# Patient Record
Sex: Female | Born: 1975 | Race: White | Hispanic: No | Smoking: Never smoker
Health system: Southern US, Community
[De-identification: ages and names within clinical notes are randomized; demographics above are authoritative.]

## PROBLEM LIST (undated history)

## (undated) DIAGNOSIS — A630 Anogenital (venereal) warts: Secondary | ICD-10-CM

## (undated) DIAGNOSIS — I10 Essential (primary) hypertension: Secondary | ICD-10-CM

## (undated) HISTORY — PX: UTERINE FIBROID SURGERY: SHX826

## (undated) HISTORY — DX: Anogenital (venereal) warts: A63.0

---

## 2012-01-25 DIAGNOSIS — O99820 Streptococcus B carrier state complicating pregnancy: Secondary | ICD-10-CM | POA: Insufficient documentation

## 2012-01-25 DIAGNOSIS — IMO0001 Reserved for inherently not codable concepts without codable children: Secondary | ICD-10-CM

## 2012-01-25 HISTORY — DX: Reserved for inherently not codable concepts without codable children: IMO0001

## 2012-01-25 HISTORY — DX: Streptococcus B carrier state complicating pregnancy: O99.820

## 2018-12-12 DIAGNOSIS — Z3042 Encounter for surveillance of injectable contraceptive: Secondary | ICD-10-CM | POA: Diagnosis not present

## 2019-03-01 DIAGNOSIS — I10 Essential (primary) hypertension: Secondary | ICD-10-CM | POA: Diagnosis not present

## 2019-03-01 DIAGNOSIS — Z3042 Encounter for surveillance of injectable contraceptive: Secondary | ICD-10-CM | POA: Diagnosis not present

## 2019-05-31 DIAGNOSIS — Z3042 Encounter for surveillance of injectable contraceptive: Secondary | ICD-10-CM | POA: Diagnosis not present

## 2019-07-03 ENCOUNTER — Emergency Department: Payer: Self-pay

## 2019-07-03 ENCOUNTER — Other Ambulatory Visit: Payer: Self-pay

## 2019-07-03 ENCOUNTER — Emergency Department
Admission: EM | Admit: 2019-07-03 | Discharge: 2019-07-03 | Disposition: A | Discharge status: Home / Self Care | Payer: Self-pay | Attending: Emergency Medicine | Admitting: Emergency Medicine

## 2019-07-03 ENCOUNTER — Encounter: Payer: Self-pay | Admitting: Emergency Medicine

## 2019-07-03 DIAGNOSIS — I10 Essential (primary) hypertension: Secondary | ICD-10-CM | POA: Insufficient documentation

## 2019-07-03 DIAGNOSIS — Z79899 Other long term (current) drug therapy: Secondary | ICD-10-CM | POA: Insufficient documentation

## 2019-07-03 DIAGNOSIS — R079 Chest pain, unspecified: Secondary | ICD-10-CM

## 2019-07-03 HISTORY — DX: Essential (primary) hypertension: I10

## 2019-07-03 LAB — BASIC METABOLIC PANEL
Anion gap: 16 — ABNORMAL HIGH (ref 5–15)
BUN: 9 mg/dL (ref 6–20)
CO2: 22 mmol/L (ref 22–32)
Calcium: 9.4 mg/dL (ref 8.9–10.3)
Chloride: 100 mmol/L (ref 98–111)
Creatinine, Ser: 0.66 mg/dL (ref 0.44–1.00)
GFR calc Af Amer: >60 mL/min (ref >60)
GFR calc non Af Amer: >60 mL/min (ref >60)
Glucose, Bld: 117 mg/dL — ABNORMAL HIGH (ref 70–99)
Potassium: 3.7 mmol/L (ref 3.5–5.1)
Sodium: 138 mmol/L (ref 135–145)

## 2019-07-03 LAB — CBC
HCT: 42.8 % (ref 36.0–46.0)
Hemoglobin: 14.8 g/dL (ref 12.0–15.0)
MCH: 31.7 pg (ref 26.0–34.0)
MCHC: 34.6 g/dL (ref 30.0–36.0)
MCV: 91.6 fL (ref 80.0–100.0)
Platelets: 276 10*3/uL (ref 150–400)
RBC: 4.67 MIL/uL (ref 3.87–5.11)
RDW: 12.5 % (ref 11.5–15.5)
WBC: 6.8 10*3/uL (ref 4.0–10.5)
nRBC: 0 % (ref 0.0–0.2)

## 2019-07-03 LAB — TROPONIN I (HIGH SENSITIVITY)
Troponin I (High Sensitivity): 3 ng/L (ref <18)
Troponin I (High Sensitivity): 6 ng/L (ref <18)
Troponin I (High Sensitivity): 5 ng/L (ref <18)

## 2019-07-03 MED ORDER — HYDROCHLOROTHIAZIDE 12.5 MG PO CAPS: 12.5000 mg | ORAL_CAPSULE | Freq: Every day | ORAL | 11 refills | Status: DC

## 2019-07-03 NOTE — ED Notes (Signed)
Pt offered something to drink

## 2019-07-03 NOTE — ED Triage Notes (Addendum)
Patient presents to the ED with chest pain that woke her up out of her sleep at 4am.  Patient reports mid-sternal chest pain that radiates down her right arm.  Patient states pain has been intermittent between arm and chest.  Patient reports history of hypertension and states she stopped taking her medication about 3 years ago without MD supervision.

## 2019-07-03 NOTE — ED Notes (Signed)
Patient had to leave ED room quickly due to critical patient arrived.  She has been sitting in the hall.  Says she is okay, just a bit anxious.  No distress.  Vs rechecked and explained that dr is in with the critical patient right now.

## 2019-07-03 NOTE — ED Notes (Signed)
Patient signed paper discharge form, no computer available.

## 2019-07-03 NOTE — ED Provider Notes (Signed)
[facility] Emergency Department Provider Note   ____________________________________________   First MD Initiated Contact with Patient 07/03/19 1154     (approximate)  I have reviewed the triage vital signs and the nursing notes.   HISTORY  Chief Complaint Chest Pain    HPI Ashley Massey is a 44 y.o. female who reports she woke up with chest pain at 4:00 this morning the pain was in the middle of her chest and radiated down the right arm.  She says sometimes it seems to switch between the arm and the chest.  She said it briefly radiated to her back for about a second or 2 and then stop.  She has a history of hypertension for several years and stopped taking her medicines about 3 years ago because she ran out of the medicines and had moved and did not have a doctor any longer.  She has not had this kind of chest pain before.   Pain is currently gone.  Pain was severe achy and tight.  She did not have any sweatiness or shortness of breath.  The pain did not radiate elsewhere.      Past Medical History:  Diagnosis Date  . Hypertension     There are no problems to display for this patient.   Past Surgical History:  Procedure Laterality Date  . UTERINE FIBROID SURGERY      Prior to Admission medications   Medication Sig Start Date End Date Taking? Authorizing Provider  hydrochlorothiazide (MICROZIDE) 12.5 MG capsule Take 1 capsule (12.5 mg total) by mouth daily. 07/03/19 07/02/20  Nena Polio, MD    Allergies Patient has no known allergies.  No family history on file.  Social History Social History   Tobacco Use  . Smoking status: Never Smoker  . Smokeless tobacco: Never Used  Substance Use Topics  . Alcohol use: Yes    Comment: daily (1-2)  . Drug use: Not on file    Review of Systems  Constitutional: No fever/chills Eyes: No visual changes. ENT: No sore throat. Cardiovascular: Currently denies chest pain. Respiratory:  Currently denies shortness of breath. Gastrointestinal: No abdominal pain.  No nausea, no vomiting.   Genitourinary: Negative for dysuria. Musculoskeletal: Negative for back pain. Skin: Negative for rash. Neurological: Negative for headaches, focal weakness   ____________________________________________   PHYSICAL EXAM:  VITAL SIGNS: ED Triage Vitals  Enc Vitals Group     BP 07/03/19 0901 (!) 195/127     Pulse Rate 07/03/19 0901 95     Resp 07/03/19 0901 16     Temp 07/03/19 0901 99 F (37.2 C)     Temp Source 07/03/19 0901 Oral     SpO2 07/03/19 0901 100 %     Weight 07/03/19 0902 140 lb (63.5 kg)     Height 07/03/19 0902 5\' 3"  (1.6 m)     Head Circumference --      Peak Flow --      Pain Score 07/03/19 0901 4     Pain Loc --      Pain Edu? --      Excl. in [place]? --     Constitutional: Alert and oriented. Well appearing and in no acute distress. Eyes: Conjunctivae are normal.  Head: Atraumatic. Nose: No congestion/rhinnorhea. Mouth/Throat: Mucous membranes are moist.  Oropharynx non-erythematous. Neck: No stridor.  Cardiovascular: Normal rate, regular rhythm. Grossly normal heart sounds.  Good peripheral circulation. Respiratory: Normal respiratory effort.  No retractions. Lungs CTAB.  Gastrointestinal: Soft and nontender. No distention. No abdominal bruits. No CVA tenderness. Musculoskeletal: No lower extremity tenderness nor edema.   Neurologic:  Normal speech and language. No gross focal neurologic deficits are appreciated.  Skin:  Skin is warm, dry and intact. No rash noted.   ____________________________________________   LABS (all labs ordered are listed, but only abnormal results are displayed)  Labs Reviewed  BASIC METABOLIC PANEL - Abnormal; Notable for the following components:      Result Value   Glucose, Bld 117 (*)    Anion gap 16 (*)    All other components within normal limits  CBC  POC URINE PREG, ED  TROPONIN I (HIGH SENSITIVITY)  TROPONIN I  (HIGH SENSITIVITY)  TROPONIN I (HIGH SENSITIVITY)   ____________________________________________  EKG EKG read interpreted by me shows normal sinus rhythm rate of 90 normal axis no acute ST-T changes  ____________________________________________  RADIOLOGY  ED MD interpretation: Chest x-ray read by radiology reviewed by me shows no acute changes  Official radiology report(s): DG Chest 2 View  Result Date: 07/03/2019 CLINICAL DATA:  Chest pain EXAM: CHEST - 2 VIEW COMPARISON:  None. FINDINGS: Lungs are clear. Heart size and pulmonary vascularity are normal. No adenopathy. No pneumothorax. No bone lesions. IMPRESSION: No abnormality noted. Electronically Signed   By: Lowella Grip III M.D.   On: 07/03/2019 09:32    ____________________________________________   PROCEDURES  Procedure(s) performed (including Critical Care):  Procedures   ____________________________________________   INITIAL IMPRESSION / ASSESSMENT AND PLAN / ED COURSE Patient is pain-free.  I will start her on some HCTZ which she thinks she was taking before.  We will have her follow-up with primary care and cardiology.  Troponin remain negative and is now decreasing somewhat from its high of 6.  High normal is 18.  She will have to follow-up with primary care several times likely to get this blood pressure under control.              ____________________________________________   FINAL CLINICAL IMPRESSION(S) / ED DIAGNOSES  Final diagnoses:  Hypertension, unspecified type  Nonspecific chest pain     ED Discharge Orders         Ordered    hydrochlorothiazide (MICROZIDE) 12.5 MG capsule  Daily     Discontinue  Reprint     07/03/19 1619           Note:  This document was prepared using Dragon voice recognition software and may include unintentional dictation errors.    Nena Polio, MD 07/03/19 [phone]

## 2019-07-03 NOTE — Discharge Instructions (Addendum)
The lab work looks okay.  I will have you follow-up with a cardiologist at least once.  Please call Dr. Cyndi Lennert office and arrange follow-up.  It is my understanding that this is doable at least for 1 visit even without insurance at all.  Please take the HCTZ 1 pill a day.  Follow-up with primary care to continue management of your blood pressure.  You can follow-up with the [facility] or the [facility] or the [facility] or [facility].  You can also try the [facility] or [place] [facility].  All of these require paperwork to be filled out and are somewhat hard to get into because there are a lot of patients trying to get into them.  Please return here for any further chest pain or blood pressures over 180 for the top number.  Please watch her salt and get a reasonable amount of exercise every day.  Return for any further problems.

## 2019-07-06 NOTE — Progress Notes (Signed)
New patient visit   Patient: Ashley Massey   DOB: [birth year]   44 y.o. Female  MRN: [id number] Visit Date: 07/07/2019  Today's healthcare provider: Marcille Buffy, FNP   Chief Complaint  Patient presents with  . New Patient (Initial Visit)   Subjective    Ashley Massey is a 44 y.o. female who presents today as a new patient to establish care.  HPI  She was seen in the ER on 07/03/2019 due to elevated BP and chest pain. She was started on HCTZ 12.5mg  daily, and reports good compliance. She denies any side effects. She has been checking her BP at home and reports that it is still running "high". She denies any episodes of dizziness, headache, or chest pain since her ER visit.   She feels much better today and has not had any episodes since. She smoked as a teenager only occasionally to look cool she reports and not ever habitual. She has had associated chest pressure and shortness of breath for years she reports.  She has always associated it with her anxiety however she reports that with this episode on 07/03/19 she started having chest pressure/ pain associated with radiating pain down her right arm that woke her from sleep and she could not get comfortable. She then went to the ED.  Denies any other associated pain or reflux.  Chest X Ray in ED most recent visit was negative. She denies any known tachycardia.    History of Hypertension with pregnancy and medication were discontinues.   Drinks around 12 beer per week.   She reports she was previously had anxiety medication and has not been taking medications since 2017. She was on Lexpro 20 mg.  She said it made her too relaxed. She was also on this for a long while and took xanax for a while however it has been a few years. She did not like the Lexapro she reports. She did try one of her daughters Atarax pills   and it helped with her anxiety.   Denies any asthma history or inhaler use.     [facility] She is due for [facility]AP soon. She is due again for Depo - she is unsure not due yet- records will be requested for PAP date and Depo. She denies any chance of pregnancy today or need of pregnancy test.     Patient  denies any fever, body aches,chills, rash, , nausea, vomiting, or diarrhea. Denies any chest pain or shortness of breath at visit today or since ED visit.   Denies dizziness, lightheadedness, pre syncopal or syncopal episodes.    Past Medical History:  Diagnosis Date  . Hypertension    Past Surgical History:  Procedure Laterality Date  . UTERINE FIBROID SURGERY     Family Status  Relation Name Status  . Mother  Alive  . Father  Alive  . Sister  Alive  . MGM  Deceased  . MGF  Deceased  . PGM  Alive  . PGF  Alive   Family History  Problem Relation Age of Onset  . Hypertension Mother   . Hypertension Father   . Healthy Sister   . Hypertension Maternal Grandmother   . Hypertension Maternal Grandfather   . Heart disease Paternal Grandmother   . Hypertension Paternal Grandmother   . Hypertension Paternal Grandfather    Social History   Socioeconomic History  . Marital status: Single    Spouse name: Not on file  .  Number of children: Not on file  . Years of education: Not on file  . Highest education level: Not on file  Occupational History  . Not on file  Tobacco Use  . Smoking status: Never Smoker  . Smokeless tobacco: Never Used  Substance and Sexual Activity  . Alcohol use: Yes    Alcohol/week: 12.0 standard drinks    Types: 12 Cans of beer per week    Comment: daily (1-2)  . Drug use: Never  . Sexual activity: Not on file  Other Topics Concern  . Not on file  Social History Narrative  . Not on file   Social Determinants of Health   Financial Resource Strain:   . Difficulty of Paying Living Expenses:   Food Insecurity:   . Worried About Charity fundraiser in the Last Year:   . Arboriculturist in the Last Year:   Transportation  Needs:   . Film/video editor (Medical):   Marland Kitchen Lack of Transportation (Non-Medical):   Physical Activity:   . Days of Exercise per Week:   . Minutes of Exercise per Session:   Stress:   . Feeling of Stress :   Social Connections:   . Frequency of Communication with Friends and Family:   . Frequency of Social Gatherings with Friends and Family:   . Attends Religious Services:   . Active Member of Clubs or Organizations:   . Attends Archivist Meetings:   Marland Kitchen Marital Status:    Outpatient Medications Prior to Visit  Medication Sig  . medroxyPROGESTERone (DEPO-PROVERA) 150 MG/ML injection Inject 150 mg into the muscle every 3 (three) months.  . [DISCONTINUED] hydrochlorothiazide (MICROZIDE) 12.5 MG capsule Take 1 capsule (12.5 mg total) by mouth daily.  . [DISCONTINUED] busPIRone (BUSPAR) 5 MG tablet Take by mouth. (Patient not taking: Reported on 07/07/2019)  . [DISCONTINUED] escitalopram (LEXAPRO) 20 MG tablet Take by mouth. (Patient not taking: Reported on 07/07/2019)  . [DISCONTINUED] losartan (COZAAR) 50 MG tablet Take by mouth. (Patient not taking: Reported on 07/07/2019)   No facility-administered medications prior to visit.  took Cozaar with pregnancy. [facility].  No Known Allergies  Immunization History  Administered Date(s) Administered  . Influenza Split 12/02/2011  . Tdap 12/27/2011    Health Maintenance  Topic Date Due  . Hepatitis C Screening  Never done  . HIV Screening  Never done  . PAP SMEAR-Modifier  Never done  . INFLUENZA VACCINE  08/06/2019  . TETANUS/TDAP  12/26/2021    Patient Care Team: Doreen Beam, FNP as PCP - General (Family Medicine)  Review of Systems  Constitutional: Negative.   HENT: Negative.   Eyes: Negative.   Respiratory: Negative.   Cardiovascular: Negative.   Gastrointestinal: Negative.   Endocrine: Negative.   Genitourinary: Negative.   Musculoskeletal: Negative.   Skin: Negative.     Allergic/Immunologic: Negative.   Neurological: Negative.   Hematological: Negative.   Psychiatric/Behavioral: Negative.     Last CBC Lab Results  Component Value Date   WBC 6.8 07/03/2019   HGB 14.8 07/03/2019   HCT 42.8 07/03/2019   MCV 91.6 07/03/2019   MCH 31.7 07/03/2019   RDW 12.5 07/03/2019   PLT 276 62/69/4854   Last metabolic panel Lab Results  Component Value Date   GLUCOSE 117 (H) 07/03/2019   NA 138 07/03/2019   K 3.7 07/03/2019   CL 100 07/03/2019   CO2 22 07/03/2019   BUN 9 07/03/2019  CREATININE 0.66 07/03/2019   GFRNONAA >60 07/03/2019   GFRAA >60 07/03/2019   CALCIUM 9.4 07/03/2019   ANIONGAP 16 (H) 07/03/2019      Objective    BP (!) 148/86 (BP Location: Left Arm, Patient Position: Sitting, Cuff Size: Normal)   Pulse 90   Temp 97.8 F (36.6 C)   Ht 5\' 3"  (1.6 m)   Wt 134 lb (60.8 kg)   SpO2 97%   BMI 23.74 kg/m  Physical Exam Vitals reviewed.  Constitutional:      General: She is not in acute distress.    Appearance: Normal appearance. She is well-developed. She is not diaphoretic.     Interventions: She is not intubated. HENT:     Head: Normocephalic and atraumatic.     Right Ear: External ear normal.     Left Ear: External ear normal.     Nose: Nose normal.     Mouth/Throat:     Mouth: Mucous membranes are moist.     Pharynx: Oropharynx is clear. Uvula midline. No oropharyngeal exudate.     Tonsils: No tonsillar exudate.  Eyes:     General: Lids are normal. No scleral icterus.       Right eye: No discharge.        Left eye: No discharge.     Conjunctiva/sclera: Conjunctivae normal.     Right eye: Right conjunctiva is not injected. No exudate or hemorrhage.    Left eye: Left conjunctiva is not injected. No exudate or hemorrhage.    Pupils: Pupils are equal, round, and reactive to light.  Neck:     Thyroid: No thyroid mass or thyromegaly (mildly enlarged on palpation ).     Vascular: Normal carotid pulses. No carotid bruit,  hepatojugular reflux or JVD.     Trachea: Trachea and phonation normal. No tracheal tenderness or tracheal deviation.     Meningeal: Brudzinski's sign and Kernig's sign absent.  Cardiovascular:     Rate and Rhythm: Normal rate and regular rhythm.     Pulses: Normal pulses.          Radial pulses are 2+ on the right side and 2+ on the left side.       Dorsalis pedis pulses are 2+ on the right side and 2+ on the left side.       Posterior tibial pulses are 2+ on the right side and 2+ on the left side.     Heart sounds: Normal heart sounds, S1 normal and S2 normal. Heart sounds not distant. No murmur heard.  No friction rub. No gallop.   Pulmonary:     Effort: Pulmonary effort is normal. No tachypnea, bradypnea, accessory muscle usage or respiratory distress. She is not intubated.     Breath sounds: Normal breath sounds. No stridor. No wheezing or rales.  Chest:     Chest wall: No tenderness.  Abdominal:     General: Bowel sounds are normal. There is no distension or abdominal bruit.     Palpations: Abdomen is soft. There is no shifting dullness, fluid wave, hepatomegaly, splenomegaly, mass or pulsatile mass.     Tenderness: There is no abdominal tenderness. There is no guarding or rebound.     Hernia: No hernia is present.  Musculoskeletal:        General: No tenderness or deformity. Normal range of motion.     Cervical back: Full passive range of motion without pain, normal range of motion and neck supple. No edema, erythema or  rigidity. No spinous process tenderness or muscular tenderness. Normal range of motion.     Right lower leg: No edema.     Left lower leg: No edema.  Lymphadenopathy:     Head:     Right side of head: No submental, submandibular, tonsillar, preauricular, posterior auricular or occipital adenopathy.     Left side of head: No submental, submandibular, tonsillar, preauricular, posterior auricular or occipital adenopathy.     Cervical: No cervical adenopathy.      Right cervical: No superficial, deep or posterior cervical adenopathy.    Left cervical: No superficial, deep or posterior cervical adenopathy.     Upper Body:     Right upper body: No supraclavicular or pectoral adenopathy.     Left upper body: No supraclavicular or pectoral adenopathy.  Skin:    General: Skin is warm and dry.     Coloration: Skin is not pale.     Findings: No abrasion, bruising, burn, ecchymosis, erythema, lesion, petechiae or rash.     Nails: There is no clubbing.  Neurological:     Mental Status: She is alert and oriented to person, place, and time.     GCS: GCS eye subscore is 4. GCS verbal subscore is 5. GCS motor subscore is 6.     Cranial Nerves: No cranial nerve deficit.     Sensory: No sensory deficit.     Motor: No tremor, atrophy, abnormal muscle tone or seizure activity.     Coordination: Coordination normal.     Gait: Gait normal.     Deep Tendon Reflexes: Reflexes are normal and symmetric. Reflexes normal. Babinski sign absent on the right side. Babinski sign absent on the left side.     Reflex Scores:      Tricep reflexes are 2+ on the right side and 2+ on the left side.      Bicep reflexes are 2+ on the right side and 2+ on the left side.      Brachioradialis reflexes are 2+ on the right side and 2+ on the left side.      Patellar reflexes are 2+ on the right side and 2+ on the left side.      Achilles reflexes are 2+ on the right side and 2+ on the left side. Psychiatric:        Speech: Speech normal.        Behavior: Behavior normal.        Thought Content: Thought content normal.        Judgment: Judgment normal.     Depression Screen PHQ 2/9 Scores 07/07/2019  PHQ - 2 Score 0   Results for orders placed or performed in visit on 07/07/19  POCT urinalysis dipstick  Result Value Ref Range   Color, UA yellow    Clarity, UA clear    Glucose, UA Negative Negative   Bilirubin, UA negative    Ketones, UA negative    Spec Grav, UA <=1.005 (A)  1.010 - 1.025   Blood, UA negative    pH, UA 6.0 5.0 - 8.0   Protein, UA Negative Negative   Urobilinogen, UA 0.2 0.2 or 1.0 E.U./dL   Nitrite, UA negative    Leukocytes, UA Negative Negative    Assessment & Plan      1. Anxiety - hydrOXYzine (ATARAX/VISTARIL) 50 MG tablet; Take 1 tablet (50 mg total) by mouth every 8 (eight) hours as needed for anxiety (may causes drowsiness, No driving with medication or legal  decisions).  Dispense: 60 tablet; Refill: 0 - buPROPion (WELLBUTRIN XL) 150 MG 24 hr tablet; Take 1 tablet (150 mg total) by mouth daily.  Dispense: 60 tablet; Refill: 0 - CBC with Differential/Platelet - Comprehensive metabolic panel - TSH  2. Screening for blood or protein in urine - POCT urinalysis dipstick  3. Hypertension, unspecified type - EKG 12-Lead  4. Encounter for screening mammogram for malignant neoplasm of breast - MM Digital Screening  5. Screening, lipid - Lipid panel  6. Vitamin D deficiency - VITAMIN D 25 Hydroxy (Vit-D Deficiency, Fractures)  7. History of chest pain - EKG 12-Lead  Medications Discontinued During This Encounter  Medication Reason  . busPIRone (BUSPAR) 5 MG tablet Completed Course  . escitalopram (LEXAPRO) 20 MG tablet Completed Course  . losartan (COZAAR) 50 MG tablet Completed Course  . hydrochlorothiazide (MICROZIDE) 12.5 MG capsule   She was not taking anything above except HCTZ 12.5 mg qd.  Increasing dosage to HCTZ 25mg  per day she will keep logs at home and if consistently over 140/90 she will call office. Consider Ace/ ARB.  EKG - ok improved in appearance from ED EKG .  Keep cardiology follow up 07/31/2019 from ED referral.   Meds ordered this encounter  Medications  . hydrochlorothiazide (HYDRODIURIL) 25 MG tablet    Sig: Take 1 tablet (25 mg total) by mouth daily.    Dispense:  90 tablet    Refill:  0  . hydrOXYzine (ATARAX/VISTARIL) 50 MG tablet    Sig: Take 1 tablet (50 mg total) by mouth every 8 (eight)  hours as needed for anxiety (may causes drowsiness, No driving with medication or legal decisions).    Dispense:  60 tablet    Refill:  0  . buPROPion (WELLBUTRIN XL) 150 MG 24 hr tablet    Sig: Take 1 tablet (150 mg total) by mouth daily.    Dispense:  60 tablet    Refill:  0   Fasting labs ordered. She will call to set up time for mammogram.  Orders Placed This Encounter  Procedures  . MM Digital Screening    Order Specific Question:   Reason for Exam (SYMPTOM  OR DIAGNOSIS REQUIRED)    Answer:   screening no previous    Order Specific Question:   Is the patient pregnant?    Answer:   No    Order Specific Question:   Preferred imaging location?    Answer:   [facility]  . CBC with Differential/Platelet  . Comprehensive metabolic panel  . TSH  . VITAMIN D 25 Hydroxy (Vit-D Deficiency, Fractures)  . Lipid panel  . POCT urinalysis dipstick  . EKG 12-Lead    Red Flags discussed. The patient was given clear instructions to go to ER or return to medical center if any red flags develop, symptoms do not improve, worsen or new problems develop. They verbalized understanding.  Advised patient call the office or your primary care doctor for an appointment if no improvement within 72 hours or if any symptoms change or worsen at any time  Advised ER or urgent Care if after hours or on weekend. Call 911 for emergency symptoms at any time.Patinet verbalized understanding of all instructions given/reviewed and treatment plan and has no further questions or concerns at this time.    Follow up on anxiety- Pap date ? Depo due date ? - post cardiology visit. / needs CPE  Return in about 1 month (around 08/07/2019), or if symptoms worsen  or fail to improve, for at any time for any worsening symptoms, Go to Emergency room/ urgent care if worse.     IWellington Hampshire Jaecob Lowden, FNP, have reviewed all documentation for this visit. The documentation on 07/07/19 for the exam, diagnosis, procedures, and  orders are all accurate and complete.   Marcille Buffy, [place] [phone] (phone) [phone] (fax)  [place]

## 2019-07-07 ENCOUNTER — Encounter: Payer: Self-pay | Admitting: Adult Health

## 2019-07-07 ENCOUNTER — Ambulatory Visit (INDEPENDENT_AMBULATORY_CARE_PROVIDER_SITE_OTHER): Payer: Medicaid Other | Admitting: Adult Health

## 2019-07-07 ENCOUNTER — Other Ambulatory Visit: Payer: Self-pay

## 2019-07-07 VITALS — BP 148/86 | HR 90 | Temp 97.8°F | Ht 63.0 in | Wt 134.0 lb

## 2019-07-07 DIAGNOSIS — I10 Essential (primary) hypertension: Secondary | ICD-10-CM

## 2019-07-07 DIAGNOSIS — E559 Vitamin D deficiency, unspecified: Secondary | ICD-10-CM

## 2019-07-07 DIAGNOSIS — F419 Anxiety disorder, unspecified: Secondary | ICD-10-CM

## 2019-07-07 DIAGNOSIS — Z87898 Personal history of other specified conditions: Secondary | ICD-10-CM

## 2019-07-07 DIAGNOSIS — Z1389 Encounter for screening for other disorder: Secondary | ICD-10-CM

## 2019-07-07 DIAGNOSIS — Z1231 Encounter for screening mammogram for malignant neoplasm of breast: Secondary | ICD-10-CM

## 2019-07-07 DIAGNOSIS — Z13220 Encounter for screening for lipoid disorders: Secondary | ICD-10-CM

## 2019-07-07 LAB — POCT URINALYSIS DIPSTICK
Bilirubin, UA: NEGATIVE
Blood, UA: NEGATIVE
Glucose, UA: NEGATIVE
Ketones, UA: NEGATIVE
Leukocytes, UA: NEGATIVE
Nitrite, UA: NEGATIVE
Protein, UA: NEGATIVE
Spec Grav, UA: <=1.005 — AB (ref 1.010–1.025)
Urobilinogen, UA: 0.2 E.U./dL
pH, UA: 6 (ref 5.0–8.0)

## 2019-07-07 MED ORDER — BUPROPION HCL ER (XL) 150 MG PO TB24: 150.0000 mg | ORAL_TABLET | Freq: Every day | ORAL | 0 refills | Status: DC

## 2019-07-07 MED ORDER — HYDROXYZINE HCL 50 MG PO TABS: 50.0000 mg | ORAL_TABLET | Freq: Three times a day (TID) | ORAL | 0 refills | Status: DC | PRN

## 2019-07-07 MED ORDER — HYDROCHLOROTHIAZIDE 25 MG PO TABS: 25.0000 mg | ORAL_TABLET | Freq: Every day | ORAL | 0 refills | Status: DC

## 2019-07-07 NOTE — Patient Instructions (Addendum)
It was nice meeting you and  Our practice looks forward to helping you with your health.    Return to our lab for fasting labs if you are not fasting today- nothing to  eat or drink for 8 hours before labs.   You can get your health department records sent to the office.  Labs ordered are below.  Orders Placed This Encounter  Procedures  . MM Digital Screening    Order Specific Question:   Reason for Exam (SYMPTOM  OR DIAGNOSIS REQUIRED)    Answer:   screening no previous    Order Specific Question:   Is the patient pregnant?    Answer:   No    Order Specific Question:   Preferred imaging location?    Answer:   [facility]  . CBC with Differential/Platelet  . Comprehensive metabolic panel  . TSH  . VITAMIN D 25 Hydroxy (Vit-D Deficiency, Fractures)  . Lipid panel  . POCT urinalysis dipstick  . EKG 12-Lead    Call to schedule mammogram they will not call you since it is a screening.  Call to schedule your screening mammogram. Your orders have been placed for your exam.  Let our office know if you have questions, concerns, or any difficulty scheduling.  If normal results then yearly screening mammograms are recommended unless you notice  Changes in your breast then you should schedule a follow up office visit. If abnormal results  Further imaging will be warranted and sooner follow up as determined by the radiologist at the [facility].   [facility] at [facility] [place], [place] [address]  Main: [phone]    Call to schedule sooner follow up if any symptoms worsening or not improving.  Seek after hours care if office is closed or if emergency.    DASH Eating Plan DASH stands for "Dietary Approaches to Stop Hypertension." The DASH eating plan is a healthy eating plan that has been shown to reduce high blood pressure (hypertension). It may also reduce your risk for type 2 diabetes, heart disease, and stroke. The DASH eating  plan may also help with weight loss. What are tips for following this plan?  General guidelines  Avoid eating more than 2,300 mg (milligrams) of salt (sodium) a day. If you have hypertension, you may need to reduce your sodium intake to 1,500 mg a day.  Limit alcohol intake to no more than 1 drink a day for nonpregnant women and 2 drinks a day for men. One drink equals 12 oz of beer, 5 oz of wine, or 1 oz of hard liquor.  Work with your health care provider to maintain a healthy body weight or to lose weight. Ask what an ideal weight is for you.  Get at least 30 minutes of exercise that causes your heart to beat faster (aerobic exercise) most days of the week. Activities may include walking, swimming, or biking.  Work with your health care provider or diet and nutrition specialist (dietitian) to adjust your eating plan to your individual calorie needs. Reading food labels   Check food labels for the amount of sodium per serving. Choose foods with less than 5 percent of the Daily Value of sodium. Generally, foods with less than 300 mg of sodium per serving fit into this eating plan.  To find whole grains, look for the word "whole" as the first word in the ingredient list. Shopping  Buy products labeled as "low-sodium" or "no salt  added."  Buy fresh foods. Avoid canned foods and premade or frozen meals. Cooking  Avoid adding salt when cooking. Use salt-free seasonings or herbs instead of table salt or sea salt. Check with your health care provider or pharmacist before using salt substitutes.  Do not fry foods. Cook foods using healthy methods such as baking, boiling, grilling, and broiling instead.  Cook with heart-healthy oils, such as olive, canola, soybean, or sunflower oil. Meal planning  Eat a balanced diet that includes: ? 5 or more servings of fruits and vegetables each day. At each meal, try to fill half of your plate with fruits and vegetables. ? Up to 6-8 servings of  whole grains each day. ? Less than 6 oz of lean meat, poultry, or fish each day. A 3-oz serving of meat is about the same size as a deck of cards. One egg equals 1 oz. ? 2 servings of low-fat dairy each day. ? A serving of nuts, seeds, or beans 5 times each week. ? Heart-healthy fats. Healthy fats called Omega-3 fatty acids are found in foods such as flaxseeds and coldwater fish, like sardines, salmon, and mackerel.  Limit how much you eat of the following: ? Canned or prepackaged foods. ? Food that is high in trans fat, such as fried foods. ? Food that is high in saturated fat, such as fatty meat. ? Sweets, desserts, sugary drinks, and other foods with added sugar. ? Full-fat dairy products.  Do not salt foods before eating.  Try to eat at least 2 vegetarian meals each week.  Eat more home-cooked food and less restaurant, buffet, and fast food.  When eating at a restaurant, ask that your food be prepared with less salt or no salt, if possible. What foods are recommended? The items listed may not be a complete list. Talk with your dietitian about what dietary choices are best for you. Grains Whole-grain or whole-wheat bread. Whole-grain or whole-wheat pasta. Brown rice. [place]. Bulgur. Whole-grain and low-sodium cereals. Pita bread. Low-fat, low-sodium crackers. Whole-wheat flour tortillas. Vegetables Fresh or frozen vegetables (raw, steamed, roasted, or grilled). Low-sodium or reduced-sodium tomato and vegetable juice. Low-sodium or reduced-sodium tomato sauce and tomato paste. Low-sodium or reduced-sodium canned vegetables. Fruits All fresh, dried, or frozen fruit. Canned fruit in natural juice (without added sugar). Meat and other protein foods Skinless chicken or [place]. Ground chicken or [place]. Pork with fat trimmed off. Fish and seafood. Egg whites. Dried beans, peas, or lentils. Unsalted nuts, nut butters, and seeds. Unsalted canned beans. Lean cuts of beef with fat  trimmed off. Low-sodium, lean deli meat. Dairy Low-fat (1%) or fat-free (skim) milk. Fat-free, low-fat, or reduced-fat cheeses. Nonfat, low-sodium ricotta or cottage cheese. Low-fat or nonfat yogurt. Low-fat, low-sodium cheese. Fats and oils Soft margarine without trans fats. Vegetable oil. Low-fat, reduced-fat, or light mayonnaise and salad dressings (reduced-sodium). Canola, safflower, olive, soybean, and sunflower oils. Avocado. Seasoning and other foods Herbs. Spices. Seasoning mixes without salt. Unsalted popcorn and pretzels. Fat-free sweets. What foods are not recommended? The items listed may not be a complete list. Talk with your dietitian about what dietary choices are best for you. Grains Baked goods made with fat, such as croissants, muffins, or some breads. Dry pasta or rice meal packs. Vegetables Creamed or fried vegetables. Vegetables in a cheese sauce. Regular canned vegetables (not low-sodium or reduced-sodium). Regular canned tomato sauce and paste (not low-sodium or reduced-sodium). Regular tomato and vegetable juice (not low-sodium or reduced-sodium). Angie Fava. Olives. Fruits Canned fruit  in a light or heavy syrup. Fried fruit. Fruit in cream or butter sauce. Meat and other protein foods Fatty cuts of meat. Ribs. Fried meat. Berniece Salines. Sausage. Bologna and other processed lunch meats. Salami. Fatback. Hotdogs. Bratwurst. Salted nuts and seeds. Canned beans with added salt. Canned or smoked fish. Whole eggs or egg yolks. Chicken or [place] with skin. Dairy Whole or 2% milk, cream, and half-and-half. Whole or full-fat cream cheese. Whole-fat or sweetened yogurt. Full-fat cheese. Nondairy creamers. Whipped toppings. Processed cheese and cheese spreads. Fats and oils Butter. Stick margarine. Lard. Shortening. Ghee. Bacon fat. Tropical oils, such as coconut, palm kernel, or palm oil. Seasoning and other foods Salted popcorn and pretzels. Onion salt, garlic salt, seasoned salt, table  salt, and sea salt. Worcestershire sauce. Tartar sauce. Barbecue sauce. Teriyaki sauce. Soy sauce, including reduced-sodium. Steak sauce. Canned and packaged gravies. Fish sauce. Oyster sauce. Cocktail sauce. Horseradish that you find on the shelf. Ketchup. Mustard. Meat flavorings and tenderizers. Bouillon cubes. Hot sauce and Tabasco sauce. Premade or packaged marinades. Premade or packaged taco seasonings. Relishes. Regular salad dressings. Where to find more information:  National Heart, Lung, and [place]: https://wilson-eaton.com/  American Heart Association: www.heart.org Summary  The DASH eating plan is a healthy eating plan that has been shown to reduce high blood pressure (hypertension). It may also reduce your risk for type 2 diabetes, heart disease, and stroke.  With the DASH eating plan, you should limit salt (sodium) intake to 2,300 mg a day. If you have hypertension, you may need to reduce your sodium intake to 1,500 mg a day.  When on the DASH eating plan, aim to eat more fresh fruits and vegetables, whole grains, lean proteins, low-fat dairy, and heart-healthy fats.  Work with your health care provider or diet and nutrition specialist (dietitian) to adjust your eating plan to your individual calorie needs. This information is not intended to replace advice given to you by your health care provider. Make sure you discuss any questions you have with your health care provider. Document Revised: 12/04/2016 Document Reviewed: 12/16/2015 Elsevier Patient Education  2020 [employer].  Hypertension, Adult Hypertension is another name for high blood pressure. High blood pressure forces your heart to work harder to pump blood. This can cause problems over time. There are two numbers in a blood pressure reading. There is a top number (systolic) over a bottom number (diastolic). It is best to have a blood pressure that is below 120/80. Healthy choices can help lower your blood pressure, or  you may need medicine to help lower it. What are the causes? The cause of this condition is not known. Some conditions may be related to high blood pressure. What increases the risk?  Smoking.  Having type 2 diabetes mellitus, high cholesterol, or both.  Not getting enough exercise or physical activity.  Being overweight.  Having too much fat, sugar, calories, or salt (sodium) in your diet.  Drinking too much alcohol.  Having long-term (chronic) kidney disease.  Having a family history of high blood pressure.  Age. Risk increases with age.  Race. You may be at higher risk if you are African American.  Gender. Men are at higher risk than women before age 73. After age 68, women are at higher risk than men.  Having obstructive sleep apnea.  Stress. What are the signs or symptoms?  High blood pressure may not cause symptoms. Very high blood pressure (hypertensive crisis) may cause: ? Headache. ? Feelings of worry or  nervousness (anxiety). ? Shortness of breath. ? Nosebleed. ? A feeling of being sick to your stomach (nausea). ? Throwing up (vomiting). ? Changes in how you see. ? Very bad chest pain. ? Seizures. How is this treated?  This condition is treated by making healthy lifestyle changes, such as: ? Eating healthy foods. ? Exercising more. ? Drinking less alcohol.  Your health care provider may prescribe medicine if lifestyle changes are not enough to get your blood pressure under control, and if: ? Your top number is above 130. ? Your bottom number is above 80.  Your personal target blood pressure may vary. Follow these instructions at home: Eating and drinking   If told, follow the DASH eating plan. To follow this plan: ? Fill one half of your plate at each meal with fruits and vegetables. ? Fill one fourth of your plate at each meal with whole grains. Whole grains include whole-wheat pasta, brown rice, and whole-grain bread. ? Eat or drink low-fat  dairy products, such as skim milk or low-fat yogurt. ? Fill one fourth of your plate at each meal with low-fat (lean) proteins. Low-fat proteins include fish, chicken without skin, eggs, beans, and tofu. ? Avoid fatty meat, cured and processed meat, or chicken with skin. ? Avoid pre-made or processed food.  Eat less than 1,500 mg of salt each day.  Do not drink alcohol if: ? Your doctor tells you not to drink. ? You are pregnant, may be pregnant, or are planning to become pregnant.  If you drink alcohol: ? Limit how much you use to:  0-1 drink a day for women.  0-2 drinks a day for men. ? Be aware of how much alcohol is in your drink. In the [place] one drink equals one 12 oz bottle of beer (355 mL), one 5 oz glass of wine (148 mL), or one 1 oz glass of hard liquor (44 mL). Lifestyle   Work with your doctor to stay at a healthy weight or to lose weight. Ask your doctor what the best weight is for you.  Get at least 30 minutes of exercise most days of the week. This may include walking, swimming, or biking.  Get at least 30 minutes of exercise that strengthens your muscles (resistance exercise) at least 3 days a week. This may include lifting weights or doing Pilates.  Do not use any products that contain nicotine or tobacco, such as cigarettes, e-cigarettes, and chewing tobacco. If you need help quitting, ask your doctor.  Check your blood pressure at home as told by your doctor.  Keep all follow-up visits as told by your doctor. This is important. Medicines  Take over-the-counter and prescription medicines only as told by your doctor. Follow directions carefully.  Do not skip doses of blood pressure medicine. The medicine does not work as well if you skip doses. Skipping doses also puts you at risk for problems.  Ask your doctor about side effects or reactions to medicines that you should watch for. Contact a doctor if you:  Think you are having a reaction to the medicine you  are taking.  Have headaches that keep coming back (recurring).  Feel dizzy.  Have swelling in your ankles.  Have trouble with your vision. Get help right away if you:  Get a very bad headache.  Start to feel mixed up (confused).  Feel weak or numb.  Feel faint.  Have very bad pain in your: ? Chest. ? Belly (abdomen).  Throw up  more than once.  Have trouble breathing. Summary  Hypertension is another name for high blood pressure.  High blood pressure forces your heart to work harder to pump blood.  For most people, a normal blood pressure is less than 120/80.  Making healthy choices can help lower blood pressure. If your blood pressure does not get lower with healthy choices, you may need to take medicine. This information is not intended to replace advice given to you by your health care provider. Make sure you discuss any questions you have with your health care provider. Document Revised: 09/01/2017 Document Reviewed: 09/01/2017 Elsevier Patient Education  2020 [place] Maintenance, Female Adopting a healthy lifestyle and getting preventive care are important in promoting health and wellness. Ask your health care provider about:  The right schedule for you to have regular tests and exams.  Things you can do on your own to prevent diseases and keep yourself healthy. What should I know about diet, weight, and exercise? Eat a healthy diet   Eat a diet that includes plenty of vegetables, fruits, low-fat dairy products, and lean protein.  Do not eat a lot of foods that are high in solid fats, added sugars, or sodium. Maintain a healthy weight Body mass index (BMI) is used to identify weight problems. It estimates body fat based on height and weight. Your health care provider can help determine your BMI and help you achieve or maintain a healthy weight. Get regular exercise Get regular exercise. This is one of the most important things you can do  for your health. Most adults should:  Exercise for at least 150 minutes each week. The exercise should increase your heart rate and make you sweat (moderate-intensity exercise).  Do strengthening exercises at least twice a week. This is in addition to the moderate-intensity exercise.  Spend less time sitting. Even light physical activity can be beneficial. Watch cholesterol and blood lipids Have your blood tested for lipids and cholesterol at 42 years of age, then have this test every 5 years. Have your cholesterol levels checked more often if:  Your lipid or cholesterol levels are high.  You are older than 42 years of age.  You are at high risk for heart disease. What should I know about cancer screening? Depending on your health history and family history, you may need to have cancer screening at various ages. This may include screening for:  Breast cancer.  Cervical cancer.  Colorectal cancer.  Skin cancer.  Lung cancer. What should I know about heart disease, diabetes, and high blood pressure? Blood pressure and heart disease  High blood pressure causes heart disease and increases the risk of stroke. This is more likely to develop in people who have high blood pressure readings, are of African descent, or are overweight.  Have your blood pressure checked: ? Every 3-5 years if you are 43-58 years of age. ? Every year if you are 18 years old or older. Diabetes Have regular diabetes screenings. This checks your fasting blood sugar level. Have the screening done:  Once every three years after age 36 if you are at a normal weight and have a low risk for diabetes.  More often and at a younger age if you are overweight or have a high risk for diabetes. What should I know about preventing infection? Hepatitis B If you have a higher risk for hepatitis B, you should be screened for this virus. Talk with your health care provider to  find out if you are at risk for hepatitis B  infection. Hepatitis C Testing is recommended for:  Everyone born from [birth year] through 1963.  Anyone with known risk factors for hepatitis C. Sexually transmitted infections (STIs)  Get screened for STIs, including gonorrhea and chlamydia, if: ? You are sexually active and are younger than 42 years of age. ? You are older than 43 years of age and your health care provider tells you that you are at risk for this type of infection. ? Your sexual activity has changed since you were last screened, and you are at increased risk for chlamydia or gonorrhea. Ask your health care provider if you are at risk.  Ask your health care provider about whether you are at high risk for HIV. Your health care provider may recommend a prescription medicine to help prevent HIV infection. If you choose to take medicine to prevent HIV, you should first get tested for HIV. You should then be tested every 3 months for as long as you are taking the medicine. Pregnancy  If you are about to stop having your period (premenopausal) and you may become pregnant, seek counseling before you get pregnant.  Take 400 to 800 micrograms (mcg) of folic acid every day if you become pregnant.  Ask for birth control (contraception) if you want to prevent pregnancy. Osteoporosis and menopause Osteoporosis is a disease in which the bones lose minerals and strength with aging. This can result in bone fractures. If you are 32 years old or older, or if you are at risk for osteoporosis and fractures, ask your health care provider if you should:  Be screened for bone loss.  Take a calcium or vitamin D supplement to lower your risk of fractures.  Be given hormone replacement therapy (HRT) to treat symptoms of menopause. Follow these instructions at home: Lifestyle  Do not use any products that contain nicotine or tobacco, such as cigarettes, e-cigarettes, and chewing tobacco. If you need help quitting, ask your health care  provider.  Do not use street drugs.  Do not share needles.  Ask your health care provider for help if you need support or information about quitting drugs. Alcohol use  Do not drink alcohol if: ? Your health care provider tells you not to drink. ? You are pregnant, may be pregnant, or are planning to become pregnant.  If you drink alcohol: ? Limit how much you use to 0-1 drink a day. ? Limit intake if you are breastfeeding.  Be aware of how much alcohol is in your drink. In the [place] one drink equals one 12 oz bottle of beer (355 mL), one 5 oz glass of wine (148 mL), or one 1 oz glass of hard liquor (44 mL). General instructions  Schedule regular health, dental, and eye exams.  Stay current with your vaccines.  Tell your health care provider if: ? You often feel depressed. ? You have ever been abused or do not feel safe at home. Summary  Adopting a healthy lifestyle and getting preventive care are important in promoting health and wellness.  Follow your health care provider's instructions about healthy diet, exercising, and getting tested or screened for diseases.  Follow your health care provider's instructions on monitoring your cholesterol and blood pressure. This information is not intended to replace advice given to you by your health care provider. Make sure you discuss any questions you have with your health care provider. Document Revised: 12/15/2017 Document Reviewed: 12/15/2017 Elsevier Patient Education  [place] Anxiety Disorder, Adult Generalized anxiety disorder (GAD) is a mental health disorder. People with this condition constantly worry about everyday events. Unlike normal anxiety, worry related to GAD is not triggered by a specific event. These worries also do not fade or get better with time. GAD interferes with life functions, including relationships, work, and school. GAD can vary from mild to severe. People with severe GAD can have  intense waves of anxiety with physical symptoms (panic attacks). What are the causes? The exact cause of GAD is not known. What increases the risk? This condition is more likely to develop in:  Women.  People who have a family history of anxiety disorders.  People who are very shy.  People who experience very stressful life events, such as the death of a loved one.  People who have a very stressful family environment. What are the signs or symptoms? People with GAD often worry excessively about many things in their lives, such as their health and family. They may also be overly concerned about:  Doing well at work.  Being on time.  Natural disasters.  Friendships. Physical symptoms of GAD include:  Fatigue.  Muscle tension or having muscle twitches.  Trembling or feeling shaky.  Being easily startled.  Feeling like your heart is pounding or racing.  Feeling out of breath or like you cannot take a deep breath.  Having trouble falling asleep or staying asleep.  Sweating.  Nausea, diarrhea, or irritable bowel syndrome (IBS).  Headaches.  Trouble concentrating or remembering facts.  Restlessness.  Irritability. How is this diagnosed? Your health care provider can diagnose GAD based on your symptoms and medical history. You will also have a physical exam. The health care provider will ask specific questions about your symptoms, including how severe they are, when they started, and if they come and go. Your health care provider may ask you about your use of alcohol or drugs, including prescription medicines. Your health care provider may refer you to a mental health specialist for further evaluation. Your health care provider will do a thorough examination and may perform additional tests to rule out other possible causes of your symptoms. To be diagnosed with GAD, a person must have anxiety that:  Is out of his or her control.  Affects several different aspects  of his or her life, such as work and relationships.  Causes distress that makes him or her unable to take part in normal activities.  Includes at least three physical symptoms of GAD, such as restlessness, fatigue, trouble concentrating, irritability, muscle tension, or sleep problems. Before your health care provider can confirm a diagnosis of GAD, these symptoms must be present more days than they are not, and they must last for six months or longer. How is this treated? The following therapies are usually used to treat GAD:  Medicine. Antidepressant medicine is usually prescribed for long-term daily control. Antianxiety medicines may be added in severe cases, especially when panic attacks occur.  Talk therapy (psychotherapy). Certain types of talk therapy can be helpful in treating GAD by providing support, education, and guidance. Options include: ? Cognitive behavioral therapy (CBT). People learn coping skills and techniques to ease their anxiety. They learn to identify unrealistic or negative thoughts and behaviors and to replace them with positive ones. ? Acceptance and commitment therapy (ACT). This treatment teaches people how to be mindful as a way to cope with unwanted thoughts and feelings. ? Biofeedback. This process trains you to  manage your body's response (physiological response) through breathing techniques and relaxation methods. You will work with a therapist while machines are used to monitor your physical symptoms.  Stress management techniques. These include yoga, meditation, and exercise. A mental health specialist can help determine which treatment is best for you. Some people see improvement with one type of therapy. However, other people require a combination of therapies. Follow these instructions at home:  Take over-the-counter and prescription medicines only as told by your health care provider.  Try to maintain a normal routine.  Try to anticipate stressful  situations and allow extra time to manage them.  Practice any stress management or self-calming techniques as taught by your health care provider.  Do not punish yourself for setbacks or for not making progress.  Try to recognize your accomplishments, even if they are small.  Keep all follow-up visits as told by your health care provider. This is important. Contact a health care provider if:  Your symptoms do not get better.  Your symptoms get worse.  You have signs of depression, such as: ? A persistently sad, cranky, or irritable mood. ? Loss of enjoyment in activities that used to bring you joy. ? Change in weight or eating. ? Changes in sleeping habits. ? Avoiding friends or family members. ? Loss of energy for normal tasks. ? Feelings of guilt or worthlessness. Get help right away if:  You have serious thoughts about hurting yourself or others. If you ever feel like you may hurt yourself or others, or have thoughts about taking your own life, get help right away. You can go to your nearest emergency department or call:  Your local emergency services (911 in the [place]  A suicide crisis helpline, such as the [facility] at 825-323-7102. This is open 24 hours a day. Summary  Generalized anxiety disorder (GAD) is a mental health disorder that involves worry that is not triggered by a specific event.  People with GAD often worry excessively about many things in their lives, such as their health and family.  GAD may cause physical symptoms such as restlessness, trouble concentrating, sleep problems, frequent sweating, nausea, diarrhea, headaches, and trembling or muscle twitching.  A mental health specialist can help determine which treatment is best for you. Some people see improvement with one type of therapy. However, other people require a combination of therapies. This information is not intended to replace advice given to you by your health  care provider. Make sure you discuss any questions you have with your health care provider. Document Revised: 12/04/2016 Document Reviewed: 11/12/2015 Elsevier Patient Education  [place]. Hydroxyzine capsules or tablets What is this medicine? HYDROXYZINE (hye [place] i zeen) is an antihistamine. This medicine is used to treat allergy symptoms. It is also used to treat anxiety and tension. This medicine can be used with other medicines to induce sleep before surgery. This medicine may be used for other purposes; ask your health care provider or pharmacist if you have questions. COMMON BRAND NAME(S): ANX, Atarax, Rezine, Vistaril What should I tell my health care provider before I take this medicine? They need to know if you have any of these conditions:  glaucoma  heart disease  history of irregular heartbeat  kidney disease  liver disease  lung or breathing disease, like asthma  stomach or intestine problems  thyroid disease  trouble passing urine  an unusual or allergic reaction to hydroxyzine, cetirizine, other medicines, foods, dyes or preservatives  pregnant  or trying to get pregnant  breast-feeding How should I use this medicine? Take this medicine by mouth with a full glass of water. Follow the directions on the prescription label. You may take this medicine with food or on an empty stomach. Take your medicine at regular intervals. Do not take your medicine more often than directed. Talk to your pediatrician regarding the use of this medicine in children. Special care may be needed. While this drug may be prescribed for children as young as 69 years of age for selected conditions, precautions do apply. Patients over 66 years old may have a stronger reaction and need a smaller dose. Overdosage: If you think you have taken too much of this medicine contact a poison control center or emergency room at once. NOTE: This medicine is only for you. Do not share this  medicine with others. What if I miss a dose? If you miss a dose, take it as soon as you can. If it is almost time for your next dose, take only that dose. Do not take double or extra doses. What may interact with this medicine? Do not take this medicine with any of the following medications:  cisapride  dronedarone  pimozide  thioridazine This medicine may also interact with the following medications:  alcohol  antihistamines for allergy, cough, and cold  atropine  barbiturate medicines for sleep or seizures, like phenobarbital  certain antibiotics like erythromycin or clarithromycin  certain medicines for anxiety or sleep  certain medicines for bladder problems like oxybutynin, tolterodine  certain medicines for depression or psychotic disturbances  certain medicines for irregular heart beat  certain medicines for Parkinson's disease like benztropine, trihexyphenidyl  certain medicines for seizures like phenobarbital, primidone  certain medicines for stomach problems like dicyclomine, hyoscyamine  certain medicines for travel sickness like scopolamine  ipratropium  narcotic medicines for pain  other medicines that prolong the QT interval (an abnormal heart rhythm) like dofetilide This list may not describe all possible interactions. Give your health care provider a list of all the medicines, herbs, non-prescription drugs, or dietary supplements you use. Also tell them if you smoke, drink alcohol, or use illegal drugs. Some items may interact with your medicine. What should I watch for while using this medicine? Tell your doctor or health care professional if your symptoms do not improve. You may get drowsy or dizzy. Do not drive, use machinery, or do anything that needs mental alertness until you know how this medicine affects you. Do not stand or sit up quickly, especially if you are an older patient. This reduces the risk of dizzy or fainting spells. Alcohol may  interfere with the effect of this medicine. Avoid alcoholic drinks. Your mouth may get dry. Chewing sugarless gum or sucking hard candy, and drinking plenty of water may help. Contact your doctor if the problem does not go away or is severe. This medicine may cause dry eyes and blurred vision. If you wear contact lenses you may feel some discomfort. Lubricating drops may help. See your eye doctor if the problem does not go away or is severe. If you are receiving skin tests for allergies, tell your doctor you are using this medicine. What side effects may I notice from receiving this medicine? Side effects that you should report to your doctor or health care professional as soon as possible:  allergic reactions like skin rash, itching or hives, swelling of the face, lips, or tongue  changes in vision  confusion  fast, irregular  heartbeat  seizures  tremor  trouble passing urine or change in the amount of urine Side effects that usually do not require medical attention (report to your doctor or health care professional if they continue or are bothersome):  constipation  drowsiness  dry mouth  headache  tiredness This list may not describe all possible side effects. Call your doctor for medical advice about side effects. You may report side effects to FDA at 1-800-FDA-1088. Where should I keep my medicine? Keep out of the reach of children. Store at room temperature between 15 and 30 degrees C (59 and 86 degrees F). Keep container tightly closed. Throw away any unused medicine after the expiration date. NOTE: This sheet is a summary. It may not cover all possible information. If you have questions about this medicine, talk to your doctor, pharmacist, or health care provider.  2020 Elsevier/Gold Standard (2017-12-13 13:19:55) Bupropion extended-release tablets (Depression/Mood Disorders) What is this medicine? BUPROPION (byoo PROE pee on) is used to treat depression. This  medicine may be used for other purposes; ask your health care provider or pharmacist if you have questions. COMMON BRAND NAME(S): Aplenzin, Budeprion XL, Forfivo XL, Wellbutrin XL What should I tell my health care provider before I take this medicine? They need to know if you have any of these conditions:  an eating disorder, such as anorexia or bulimia  bipolar disorder or psychosis  diabetes or high blood sugar, treated with medication  glaucoma  head injury or brain tumor  heart disease, previous heart attack, or irregular heart beat  high blood pressure  kidney or liver disease  seizures (convulsions)  suicidal thoughts or a previous suicide attempt  Tourette's syndrome  weight loss  an unusual or allergic reaction to bupropion, other medicines, foods, dyes, or preservatives  breast-feeding  pregnant or trying to become pregnant How should I use this medicine? Take this medicine by mouth with a glass of water. Follow the directions on the prescription label. You can take it with or without food. If it upsets your stomach, take it with food. Do not crush, chew, or cut these tablets. This medicine is taken once daily at the same time each day. Do not take your medicine more often than directed. Do not stop taking this medicine suddenly except upon the advice of your doctor. Stopping this medicine too quickly may cause serious side effects or your condition may worsen. A special MedGuide will be given to you by the pharmacist with each prescription and refill. Be sure to read this information carefully each time. Talk to your pediatrician regarding the use of this medicine in children. Special care may be needed. Overdosage: If you think you have taken too much of this medicine contact a poison control center or emergency room at once. NOTE: This medicine is only for you. Do not share this medicine with others. What if I miss a dose? If you miss a dose, skip the missed dose  and take your next tablet at the regular time. Do not take double or extra doses. What may interact with this medicine? Do not take this medicine with any of the following medications:  linezolid  MAOIs like Azilect, Carbex, Eldepryl, Marplan, Nardil, and Parnate  methylene blue (injected into a vein)  other medicines that contain bupropion like Zyban This medicine may also interact with the following medications:  alcohol  certain medicines for anxiety or sleep  certain medicines for blood pressure like metoprolol, propranolol  certain medicines for depression  or psychotic disturbances  certain medicines for HIV or AIDS like efavirenz, lopinavir, nelfinavir, ritonavir  certain medicines for irregular heart beat like propafenone, flecainide  certain medicines for Parkinson's disease like amantadine, levodopa  certain medicines for seizures like carbamazepine, phenytoin, phenobarbital  cimetidine  clopidogrel  cyclophosphamide  digoxin  furazolidone  isoniazid  nicotine  orphenadrine  procarbazine  steroid medicines like prednisone or cortisone  stimulant medicines for attention disorders, weight loss, or to stay awake  tamoxifen  theophylline  thiotepa  ticlopidine  tramadol  warfarin This list may not describe all possible interactions. Give your health care provider a list of all the medicines, herbs, non-prescription drugs, or dietary supplements you use. Also tell them if you smoke, drink alcohol, or use illegal drugs. Some items may interact with your medicine. What should I watch for while using this medicine? Tell your doctor if your symptoms do not get better or if they get worse. Visit your doctor or healthcare provider for regular checks on your progress. Because it may take several weeks to see the full effects of this medicine, it is important to continue your treatment as prescribed by your doctor. This medicine may cause serious skin  reactions. They can happen weeks to months after starting the medicine. Contact your healthcare provider right away if you notice fevers or flu-like symptoms with a rash. The rash may be red or purple and then turn into blisters or peeling of the skin. Or, you might notice a red rash with swelling of the face, lips or lymph nodes in your neck or under your arms. Patients and their families should watch out for new or worsening thoughts of suicide or depression. Also watch out for sudden changes in feelings such as feeling anxious, agitated, panicky, irritable, hostile, aggressive, impulsive, severely restless, overly excited and hyperactive, or not being able to sleep. If this happens, especially at the beginning of treatment or after a change in dose, call your healthcare provider. Avoid alcoholic drinks while taking this medicine. Drinking large amounts of alcoholic beverages, using sleeping or anxiety medicines, or quickly stopping the use of these agents while taking this medicine may increase your risk for a seizure. Do not drive or use heavy machinery until you know how this medicine affects you. This medicine can impair your ability to perform these tasks. Do not take this medicine close to bedtime. It may prevent you from sleeping. Your mouth may get dry. Chewing sugarless gum or sucking hard candy, and drinking plenty of water may help. Contact your doctor if the problem does not go away or is severe. The tablet shell for some brands of this medicine does not dissolve. This is normal. The tablet shell may appear whole in the stool. This is not a cause for concern. What side effects may I notice from receiving this medicine? Side effects that you should report to your doctor or health care professional as soon as possible:  allergic reactions like skin rash, itching or hives, swelling of the face, lips, or tongue  breathing problems  changes in vision  confusion  elevated mood, decreased  need for sleep, racing thoughts, impulsive behavior  fast or irregular heartbeat  hallucinations, loss of contact with reality  increased blood pressure  rash, fever, and swollen lymph nodes  redness, blistering, peeling or loosening of the skin, including inside the mouth  seizures  suicidal thoughts or other mood changes  unusually weak or tired  vomiting Side effects that usually do not  require medical attention (report to your doctor or health care professional if they continue or are bothersome):  constipation  headache  loss of appetite  nausea  tremors  weight loss This list may not describe all possible side effects. Call your doctor for medical advice about side effects. You may report side effects to FDA at 1-800-FDA-1088. Where should I keep my medicine? Keep out of the reach of children. Store at room temperature between 15 and 30 degrees C (59 and 86 degrees F). Throw away any unused medicine after the expiration date. NOTE: This sheet is a summary. It may not cover all possible information. If you have questions about this medicine, talk to your doctor, pharmacist, or health care provider.  2020 Elsevier/Gold Standard (2018-03-17 13:45:31)

## 2019-07-08 LAB — CBC WITH DIFFERENTIAL/PLATELET
Basophils Absolute: 0.1 10*3/uL (ref 0.0–0.2)
Basos: 1 %
EOS (ABSOLUTE): 0.3 10*3/uL (ref 0.0–0.4)
Eos: 5 %
Hematocrit: 43.1 % (ref 34.0–46.6)
Hemoglobin: 14.6 g/dL (ref 11.1–15.9)
Immature Grans (Abs): 0.1 10*3/uL (ref 0.0–0.1)
Immature Granulocytes: 1 %
Lymphocytes Absolute: 1 10*3/uL (ref 0.7–3.1)
Lymphs: 15 %
MCH: 32.4 pg (ref 26.6–33.0)
MCHC: 33.9 g/dL (ref 31.5–35.7)
MCV: 96 fL (ref 79–97)
Monocytes Absolute: 0.8 10*3/uL (ref 0.1–0.9)
Monocytes: 12 %
Neutrophils Absolute: 4.4 10*3/uL (ref 1.4–7.0)
Neutrophils: 66 %
Platelets: 290 10*3/uL (ref 150–450)
RBC: 4.51 x10E6/uL (ref 3.77–5.28)
RDW: 12.7 % (ref 11.7–15.4)
WBC: 6.6 10*3/uL (ref 3.4–10.8)

## 2019-07-08 LAB — COMPREHENSIVE METABOLIC PANEL
ALT: 103 IU/L — ABNORMAL HIGH (ref 0–32)
AST: 134 IU/L — ABNORMAL HIGH (ref 0–40)
Albumin/Globulin Ratio: 2 (ref 1.2–2.2)
Albumin: 5.1 g/dL — ABNORMAL HIGH (ref 3.8–4.8)
Alkaline Phosphatase: 49 IU/L (ref 48–121)
BUN/Creatinine Ratio: 11 (ref 9–23)
BUN: 7 mg/dL (ref 6–24)
Bilirubin Total: 0.4 mg/dL (ref 0.0–1.2)
CO2: 22 mmol/L (ref 20–29)
Calcium: 10.4 mg/dL — ABNORMAL HIGH (ref 8.7–10.2)
Chloride: 97 mmol/L (ref 96–106)
Creatinine, Ser: 0.66 mg/dL (ref 0.57–1.00)
GFR calc Af Amer: 127 mL/min/{1.73_m2} (ref >59)
GFR calc non Af Amer: 110 mL/min/{1.73_m2} (ref >59)
Globulin, Total: 2.6 g/dL (ref 1.5–4.5)
Glucose: 85 mg/dL (ref 65–99)
Potassium: 4.1 mmol/L (ref 3.5–5.2)
Sodium: 138 mmol/L (ref 134–144)
Total Protein: 7.7 g/dL (ref 6.0–8.5)

## 2019-07-08 LAB — LIPID PANEL
Chol/HDL Ratio: 2.8 ratio (ref 0.0–4.4)
Cholesterol, Total: 266 mg/dL — ABNORMAL HIGH (ref 100–199)
HDL: 94 mg/dL (ref >39)
LDL Chol Calc (NIH): 156 mg/dL — ABNORMAL HIGH (ref 0–99)
Triglycerides: 98 mg/dL (ref 0–149)
VLDL Cholesterol Cal: 16 mg/dL (ref 5–40)

## 2019-07-08 LAB — TSH: TSH: 0.617 u[IU]/mL (ref 0.450–4.500)

## 2019-07-08 LAB — VITAMIN D 25 HYDROXY (VIT D DEFICIENCY, FRACTURES): Vit D, 25-Hydroxy: 31.1 ng/mL (ref 30.0–100.0)

## 2019-07-11 ENCOUNTER — Other Ambulatory Visit: Payer: Self-pay | Admitting: Adult Health

## 2019-07-11 DIAGNOSIS — R1011 Right upper quadrant pain: Secondary | ICD-10-CM | POA: Insufficient documentation

## 2019-07-11 DIAGNOSIS — Z87898 Personal history of other specified conditions: Secondary | ICD-10-CM | POA: Insufficient documentation

## 2019-07-11 DIAGNOSIS — R7401 Elevation of levels of liver transaminase levels: Secondary | ICD-10-CM | POA: Insufficient documentation

## 2019-07-11 NOTE — Progress Notes (Signed)
Doubtful non fasting caused this much increase in ALT / AST. She can return for fasting lab. Ok to place CMP and lipid panel order and give walk in instructions for fasting labs this week if able. [place] recommended but ok to recheck lab first.

## 2019-07-11 NOTE — Progress Notes (Signed)
Elevated ALT and AST - liver enzymes, added on additional labs for testing. Would like to order a ultrasound of her right upper quadrant to check liver and gallbladder given her recent symptoms. Please let me know if she is ok proceeding with this. Be sure no recent tick bites, fever chills etc  ?  Calcium mild elevation. Recheck CMP in 2 - 3 weeks.    Total cholesterol and LDL elevated.  Discuss lifestyle modification with patient e.g. increase exercise, fiber, fruits, vegetables, lean meat, and omega 3/fish intake and decrease saturated fat.    Thyroid low end normal. Lets recheck in 2- 4 months with Vitamin D.   CBC within normal limits.  Vitamin D low end normal, recommend over the counter Vitamin D 3 at 4,000 international units by mouth once daily and recheck in 3- 4 months vitamin D.

## 2019-07-12 ENCOUNTER — Ambulatory Visit: Payer: Medicaid Other

## 2019-07-12 ENCOUNTER — Telehealth: Payer: Self-pay

## 2019-07-12 DIAGNOSIS — R748 Abnormal levels of other serum enzymes: Secondary | ICD-10-CM

## 2019-07-12 NOTE — Telephone Encounter (Signed)
Patient advised. She states she will come by on Friday for labs. She states she already has a [place] scheduled for Monday.

## 2019-07-12 NOTE — Telephone Encounter (Signed)
-----   Message from Doreen Beam, [place] sent at 07/11/2019  3:41 PM EDT ----- Doubtful non fasting caused this much increase in ALT / AST. She can return for fasting lab. Ok to place CMP and lipid panel order and give walk in instructions for fasting labs this week if able. [place] recommended but ok to recheck lab first.

## 2019-07-13 ENCOUNTER — Telehealth: Payer: Self-pay | Admitting: *Deleted

## 2019-07-13 LAB — LIPASE: Lipase: 38 U/L (ref 14–72)

## 2019-07-13 LAB — HEPATITIS PANEL, ACUTE
Hep A IgM: NEGATIVE
Hep B C IgM: NEGATIVE
Hep C Virus Ab: <0.1 s/co ratio (ref 0.0–0.9)
Hepatitis B Surface Ag: NEGATIVE

## 2019-07-13 LAB — AMYLASE: Amylase: 72 U/L (ref 31–110)

## 2019-07-13 LAB — SPECIMEN STATUS REPORT

## 2019-07-13 NOTE — Progress Notes (Signed)
Hepatitis A, B and C acute are negative. Amylase and lipase within normal limits.

## 2019-07-13 NOTE — Telephone Encounter (Signed)
Patient was returning a call to the office.

## 2019-07-17 ENCOUNTER — Ambulatory Visit: Payer: Medicaid Other

## 2019-07-18 LAB — COMPREHENSIVE METABOLIC PANEL
ALT: 49 IU/L — ABNORMAL HIGH (ref 0–32)
AST: 36 IU/L (ref 0–40)
Albumin/Globulin Ratio: 1.9 (ref 1.2–2.2)
Albumin: 5.1 g/dL — ABNORMAL HIGH (ref 3.8–4.8)
Alkaline Phosphatase: 57 IU/L (ref 48–121)
BUN/Creatinine Ratio: 12 (ref 9–23)
BUN: 11 mg/dL (ref 6–24)
Bilirubin Total: 0.5 mg/dL (ref 0.0–1.2)
CO2: 18 mmol/L — ABNORMAL LOW (ref 20–29)
Calcium: 10.2 mg/dL (ref 8.7–10.2)
Chloride: 100 mmol/L (ref 96–106)
Creatinine, Ser: 0.89 mg/dL (ref 0.57–1.00)
GFR calc Af Amer: 93 mL/min/{1.73_m2} (ref >59)
GFR calc non Af Amer: 81 mL/min/{1.73_m2} (ref >59)
Globulin, Total: 2.7 g/dL (ref 1.5–4.5)
Glucose: 99 mg/dL (ref 65–99)
Potassium: 4 mmol/L (ref 3.5–5.2)
Sodium: 137 mmol/L (ref 134–144)
Total Protein: 7.8 g/dL (ref 6.0–8.5)

## 2019-07-18 LAB — LIPID PANEL
Chol/HDL Ratio: 3.5 ratio (ref 0.0–4.4)
Cholesterol, Total: 248 mg/dL — ABNORMAL HIGH (ref 100–199)
HDL: 70 mg/dL (ref >39)
LDL Chol Calc (NIH): 163 mg/dL — ABNORMAL HIGH (ref 0–99)
Triglycerides: 89 mg/dL (ref 0–149)
VLDL Cholesterol Cal: 15 mg/dL (ref 5–40)

## 2019-07-20 NOTE — Telephone Encounter (Signed)
CMP within normal limits does have some mild variance but these are okay, ALT is elevated at 49 but much better than previous avoid alcohol and Tylenol.  Recheck in 3 months CMP.  Glucose is within normal limits. Total cholesterol and LDL elevated.  Discuss lifestyle modification with patient e.g. increase exercise, fiber, fruits, vegetables, lean meat, and omega 3/fish intake and decrease saturated fat.  If patient following strict diet and exercise program already please schedule follow up appointment for medication management.

## 2019-07-31 ENCOUNTER — Ambulatory Visit: Payer: Self-pay | Admitting: Cardiology

## 2019-08-07 ENCOUNTER — Ambulatory Visit: Payer: Medicaid Other | Admitting: Adult Health

## 2019-08-28 ENCOUNTER — Other Ambulatory Visit: Payer: Self-pay | Admitting: Adult Health

## 2019-08-28 DIAGNOSIS — F419 Anxiety disorder, unspecified: Secondary | ICD-10-CM

## 2019-08-29 DIAGNOSIS — Z3042 Encounter for surveillance of injectable contraceptive: Secondary | ICD-10-CM | POA: Diagnosis not present

## 2019-08-31 ENCOUNTER — Encounter: Payer: Self-pay | Admitting: Cardiology

## 2019-08-31 ENCOUNTER — Other Ambulatory Visit: Payer: Self-pay

## 2019-08-31 ENCOUNTER — Ambulatory Visit (INDEPENDENT_AMBULATORY_CARE_PROVIDER_SITE_OTHER): Payer: Self-pay | Admitting: Cardiology

## 2019-08-31 VITALS — BP 170/110 | HR 92 | Ht 63.0 in | Wt 138.5 lb

## 2019-08-31 DIAGNOSIS — R079 Chest pain, unspecified: Secondary | ICD-10-CM

## 2019-08-31 DIAGNOSIS — I10 Essential (primary) hypertension: Secondary | ICD-10-CM

## 2019-08-31 DIAGNOSIS — E7800 Pure hypercholesterolemia, unspecified: Secondary | ICD-10-CM

## 2019-08-31 MED ORDER — LISINOPRIL 5 MG PO TABS
5.0000 mg | ORAL_TABLET | Freq: Every day | ORAL | 5 refills | Status: DC
Start: 2019-08-31 — End: 2020-04-15

## 2019-08-31 NOTE — Patient Instructions (Signed)
Medication Instructions:   Your physician has recommended you make the following change in your medication:   START taking lisinopril (ZESTRIL) 5 MG tablet: Take 1 tablet (5 mg total) by mouth daily.  *If you need a refill on your cardiac medications before your next appointment, please call your pharmacy*   Lab Work: None Ordered If you have labs (blood work) drawn today and your tests are completely normal, you will receive your results only by: Marland Kitchen MyChart Message (if you have MyChart) OR . A paper copy in the mail If you have any lab test that is abnormal or we need to change your treatment, we will call you to review the results.   Testing/Procedures: None Ordered   Follow-Up: At [facility], you and your health needs are our priority.  As part of our continuing mission to provide you with exceptional heart care, we have created designated Provider Care Teams.  These Care Teams include your primary Cardiologist (physician) and Advanced Practice Providers (APPs -  Physician Assistants and Nurse Practitioners) who all work together to provide you with the care you need, when you need it.  We recommend signing up for the patient portal called "MyChart".  Sign up information is provided on this After Visit Summary.  MyChart is used to connect with patients for Virtual Visits (Telemedicine).  Patients are able to view lab/test results, encounter notes, upcoming appointments, etc.  Non-urgent messages can be sent to your provider as well.   To learn more about what you can do with MyChart, go to [email or web address].    Your next appointment:   2 month(s)  The format for your next appointment:   In Person  Provider:   Kate Sable, MD   Other Instructions  Lisinopril Tablets What is this medicine? LISINOPRIL (lyse IN oh pril) is an ACE inhibitor. It treats high blood pressure and heart failure. It can treat heart damage after a heart attack. This medicine may be used  for other purposes; ask your health care provider or pharmacist if you have questions. COMMON BRAND NAME(S): Prinivil, Zestril What should I tell my health care provider before I take this medicine? They need to know if you have any of these conditions:  diabetes  heart or blood vessel disease  kidney disease  low blood pressure  previous swelling of the tongue, face, or lips with difficulty breathing, difficulty swallowing, hoarseness, or tightening of the throat  an unusual or allergic reaction to lisinopril, other ACE inhibitors, insect venom, foods, dyes, or preservatives  pregnant or trying to get pregnant  breast-feeding How should I use this medicine? Take this drug by mouth. Take it as directed on the prescription label at the same time every day. You can take it with or without food. If it upsets your stomach, take it with food. Keep taking it unless your health care provider tells you to stop. Talk to your health care provider about the use of this drug in children. While it may be prescribed for children as young as 6 for selected conditions, precautions do apply. Overdosage: If you think you have taken too much of this medicine contact a poison control center or emergency room at once. NOTE: This medicine is only for you. Do not share this medicine with others. What if I miss a dose? If you miss a dose, take it as soon as you can. If it is almost time for your next dose, take only that dose. Do not take double or  extra doses. What may interact with this medicine? Do not take this medicine with any of the following medications:  hymenoptera venom  sacubitril; valsartan This medicines may also interact with the following medications:  aliskiren  angiotensin receptor blockers, like losartan or valsartan  certain medicines for diabetes  diuretics  everolimus  gold compounds  lithium  NSAIDs, medicines for pain and inflammation, like ibuprofen or  naproxen  potassium salts or supplements  salt substitutes  sirolimus  temsirolimus This list may not describe all possible interactions. Give your health care provider a list of all the medicines, herbs, non-prescription drugs, or dietary supplements you use. Also tell them if you smoke, drink alcohol, or use illegal drugs. Some items may interact with your medicine. What should I watch for while using this medicine? Visit your doctor or health care professional for regular check ups. Check your blood pressure as directed. Ask your doctor what your blood pressure should be, and when you should contact him or her. Do not treat yourself for coughs, colds, or pain while you are using this medicine without asking your doctor or health care professional for advice. Some ingredients may increase your blood pressure. Women should inform their doctor if they wish to become pregnant or think they might be pregnant. There is a potential for serious side effects to an unborn child. Talk to your health care professional or pharmacist for more information. Check with your doctor or health care professional if you get an attack of severe diarrhea, nausea and vomiting, or if you sweat a lot. The loss of too much body fluid can make it dangerous for you to take this medicine. You may get drowsy or dizzy. Do not drive, use machinery, or do anything that needs mental alertness until you know how this drug affects you. Do not stand or sit up quickly, especially if you are an older patient. This reduces the risk of dizzy or fainting spells. Alcohol can make you more drowsy and dizzy. Avoid alcoholic drinks. Avoid salt substitutes unless you are told otherwise by your doctor or health care professional. What side effects may I notice from receiving this medicine? Side effects that you should report to your doctor or health care professional as soon as possible:  allergic reactions like skin rash, itching or hives,  swelling of the hands, feet, face, lips, throat, or tongue  breathing problems  signs and symptoms of kidney injury like trouble passing urine or change in the amount of urine  signs and symptoms of increased potassium like muscle weakness; chest pain; or fast, irregular heartbeat  signs and symptoms of liver injury like dark yellow or brown urine; general ill feeling or flu-like symptoms; light-colored stools; loss of appetite; nausea; right upper belly pain; unusually weak or tired; yellowing of the eyes or skin  signs and symptoms of low blood pressure like dizziness; feeling faint or lightheaded, falls; unusually weak or tired  stomach pain with or without nausea and vomiting Side effects that usually do not require medical attention (report to your doctor or health care professional if they continue or are bothersome):  changes in taste  cough  dizziness  fever  headache  sensitivity to light This list may not describe all possible side effects. Call your doctor for medical advice about side effects. You may report side effects to FDA at 1-800-FDA-1088. Where should I keep my medicine? Keep out of the reach of children and pets. Store at room temperature between 20 and 25  degrees C (68 and 77 degrees F). Protect from moisture. Keep the container tightly closed. Do not freeze. Avoid exposure to extreme heat. Throw away any unused drug after the expiration date. NOTE: This sheet is a summary. It may not cover all possible information. If you have questions about this medicine, talk to your doctor, pharmacist, or health care provider.  2020 Elsevier/Gold Standard (2018-07-27 11:38:35)

## 2019-08-31 NOTE — Progress Notes (Signed)
Cardiology Office Note:    Date:  08/31/2019   ID:  Ashley Massey, DOB 07-01-1975, MRN [id number]  PCP:  Doreen Beam, FNP  [facility] Cardiologist:  Kate Sable, MD  [place] Electrophysiologist:  None   Referring MD: Sharmon Leyden*   Chief Complaint  Patient presents with  . New Patient (Initial Visit)    Referred by Dr. Eddie Dibbles for chest pain (seen in ED on 07/03/2019); Meds verbally reviewed with patient.   Ashley Massey is a 44 y.o. female who is being seen today for the evaluation of chest pain at the request of Flinchum, Kelby Aline, F*.   History of Present Illness:    Ashley Massey is a 44 y.o. female with a hx of hypertension, anxiety who presents due to chest pain.  Patient was seen in the ED on 07/03/2019 due to chest pain.  She was asleep, woke up at about 4 AM with symptoms consistent with panic attack.  She states having previous panic attacks usually associated with chest tightness and shortness of breath.  This time symptoms of chest tightness and shortness of breath were ongoing for about 4 hours to it in the morning which prompted her to go to the emergency room.  EKG did not show any ischemia, troponin was normal x2. Outpatient follow-up recommended.  She has had on and off episodes of panic attacks since then associated with chest pain or shortness of breath.  She denies any symptoms when she is not having a panic attack.  Denies smoking.  Denies any history of heart disease.  She followed up with her primary care provider who started her on antianxiety medications.  She also takes medications for high blood pressure but feels like her blood pressure is not well controlled.  She checks at home and systolic is usually in the 150s.  Past Medical History:  Diagnosis Date  . Hypertension     Past Surgical History:  Procedure Laterality Date  . UTERINE FIBROID SURGERY      Current Medications: Current Meds  Medication Sig  .  buPROPion (WELLBUTRIN XL) 150 MG 24 hr tablet TAKE 1 TABLET BY MOUTH EVERY DAY  . hydrochlorothiazide (HYDRODIURIL) 25 MG tablet Take 1 tablet (25 mg total) by mouth daily.  . hydrOXYzine (ATARAX/VISTARIL) 50 MG tablet Take 1 tablet (50 mg total) by mouth every 8 (eight) hours as needed for anxiety (may causes drowsiness, No driving with medication or legal decisions).  . medroxyPROGESTERone (DEPO-PROVERA) 150 MG/ML injection Inject 150 mg into the muscle every 3 (three) months.     Allergies:   Patient has no known allergies.   Social History   Socioeconomic History  . Marital status: Single    Spouse name: Not on file  . Number of children: Not on file  . Years of education: Not on file  . Highest education level: Not on file  Occupational History  . Not on file  Tobacco Use  . Smoking status: Never Smoker  . Smokeless tobacco: Never Used  Vaping Use  . Vaping Use: Former  Substance and Sexual Activity  . Alcohol use: Yes    Alcohol/week: 12.0 standard drinks    Types: 12 Cans of beer per week    Comment: daily (1-2)  . Drug use: Never  . Sexual activity: Not on file  Other Topics Concern  . Not on file  Social History Narrative  . Not on file   Social Determinants of Health  Financial Resource Strain:   . Difficulty of Paying Living Expenses: Not on file  Food Insecurity:   . Worried About Charity fundraiser in the Last Year: Not on file  . Ran Out of Food in the Last Year: Not on file  Transportation Needs:   . Lack of Transportation (Medical): Not on file  . Lack of Transportation (Non-Medical): Not on file  Physical Activity:   . Days of Exercise per Week: Not on file  . Minutes of Exercise per Session: Not on file  Stress:   . Feeling of Stress : Not on file  Social Connections:   . Frequency of Communication with Friends and Family: Not on file  . Frequency of Social Gatherings with Friends and Family: Not on file  . Attends Religious Services: Not on  file  . Active Member of Clubs or Organizations: Not on file  . Attends Archivist Meetings: Not on file  . Marital Status: Not on file     Family History: The patient's family history includes Healthy in her sister; Heart disease in her paternal grandmother; Hypertension in her father, maternal grandfather, maternal grandmother, mother, paternal grandfather, and paternal grandmother.  ROS:   Please see the history of present illness.     All other systems reviewed and are negative.  EKGs/Labs/Other Studies Reviewed:    The following studies were reviewed today:   EKG:  EKG is  ordered today.  The ekg ordered today demonstrates normal sinus rhythm, normal ECG.  Recent Labs: 07/07/2019: Hemoglobin 14.6; Platelets 290; TSH 0.617 07/17/2019: ALT 49; BUN 11; Creatinine, Ser 0.89; Potassium 4.0; Sodium 137  Recent Lipid Panel    Component Value Date/Time   CHOL 248 (H) 07/17/2019 0958   TRIG 89 07/17/2019 0958   HDL 70 07/17/2019 0958   CHOLHDL 3.5 07/17/2019 0958   LDLCALC 163 (H) 07/17/2019 0958    Physical Exam:    VS:  BP (!) 170/110 (BP Location: Right Arm, Patient Position: Sitting, Cuff Size: Normal)   Pulse 92   Ht 5\' 3"  (1.6 m)   Wt 138 lb 8 oz (62.8 kg)   SpO2 99%   BMI 24.53 kg/m     Wt Readings from Last 3 Encounters:  08/31/19 138 lb 8 oz (62.8 kg)  07/07/19 134 lb (60.8 kg)  07/03/19 140 lb (63.5 kg)     GEN:  Well nourished, well developed in no acute distress HEENT: Normal NECK: No JVD; No carotid bruits LYMPHATICS: No lymphadenopathy CARDIAC: RRR, no murmurs, rubs, gallops RESPIRATORY:  Clear to auscultation without rales, wheezing or rhonchi  ABDOMEN: Soft, non-tender, non-distended MUSCULOSKELETAL:  No edema; No deformity  SKIN: Warm and dry NEUROLOGIC:  Alert and oriented x 3 PSYCHIATRIC:  Normal affect   ASSESSMENT:    1. Chest pain of uncertain etiology   2. Essential hypertension   3. Pure hypercholesterolemia    PLAN:     In order of problems listed above:  1. Patient with symptoms of chest discomfort and shortness of breath 2 months ago associated with anxiety/panic attack waking her up from sleep.  Etiology is likely anxiety in origin for chest discomfort.  I believe adequate management of her anxiety will help patient's symptoms.  Has not had similar symptoms since.  Patient educated on cardiac chest pain.  Will consider additional cardiac testing if this was to happen. 2. History of hypertension, BP not well controlled.  Continue HCTZ.  Start lisinopril 5 mg daily.  Frequent  BP checks at home recommended. 3. History of hyperlipidemia, elevated LDL on last lipid panel.  10-year ASCVD risk is 1.4%.  Patient does not fall into aspirin or statin benefit group.  Low-cholesterol diet advised.  Follow-up in 2 to 3 months for BP check.   Medication Adjustments/Labs and Tests Ordered: Current medicines are reviewed at length with the patient today.  Concerns regarding medicines are outlined above.  Orders Placed This Encounter  Procedures  . EKG 12-Lead   Meds ordered this encounter  Medications  . lisinopril (ZESTRIL) 5 MG tablet    Sig: Take 1 tablet (5 mg total) by mouth daily.    Dispense:  30 tablet    Refill:  5    Patient Instructions  Medication Instructions:   Your physician has recommended you make the following change in your medication:   START taking lisinopril (ZESTRIL) 5 MG tablet: Take 1 tablet (5 mg total) by mouth daily.  *If you need a refill on your cardiac medications before your next appointment, please call your pharmacy*   Lab Work: None Ordered If you have labs (blood work) drawn today and your tests are completely normal, you will receive your results only by: Marland Kitchen MyChart Message (if you have MyChart) OR . A paper copy in the mail If you have any lab test that is abnormal or we need to change your treatment, we will call you to review the  results.   Testing/Procedures: None Ordered   Follow-Up: At [facility], you and your health needs are our priority.  As part of our continuing mission to provide you with exceptional heart care, we have created designated Provider Care Teams.  These Care Teams include your primary Cardiologist (physician) and Advanced Practice Providers (APPs -  Physician Assistants and Nurse Practitioners) who all work together to provide you with the care you need, when you need it.  We recommend signing up for the patient portal called "MyChart".  Sign up information is provided on this After Visit Summary.  MyChart is used to connect with patients for Virtual Visits (Telemedicine).  Patients are able to view lab/test results, encounter notes, upcoming appointments, etc.  Non-urgent messages can be sent to your provider as well.   To learn more about what you can do with MyChart, go to [email or web address].    Your next appointment:   2 month(s)  The format for your next appointment:   In Person  Provider:   Kate Sable, MD   Other Instructions  Lisinopril Tablets What is this medicine? LISINOPRIL (lyse IN oh pril) is an ACE inhibitor. It treats high blood pressure and heart failure. It can treat heart damage after a heart attack. This medicine may be used for other purposes; ask your health care provider or pharmacist if you have questions. COMMON BRAND NAME(S): Prinivil, Zestril What should I tell my health care provider before I take this medicine? They need to know if you have any of these conditions:  diabetes  heart or blood vessel disease  kidney disease  low blood pressure  previous swelling of the tongue, face, or lips with difficulty breathing, difficulty swallowing, hoarseness, or tightening of the throat  an unusual or allergic reaction to lisinopril, other ACE inhibitors, insect venom, foods, dyes, or preservatives  pregnant or trying to get  pregnant  breast-feeding How should I use this medicine? Take this drug by mouth. Take it as directed on the prescription label at the same time every day. You can take  it with or without food. If it upsets your stomach, take it with food. Keep taking it unless your health care provider tells you to stop. Talk to your health care provider about the use of this drug in children. While it may be prescribed for children as young as 6 for selected conditions, precautions do apply. Overdosage: If you think you have taken too much of this medicine contact a poison control center or emergency room at once. NOTE: This medicine is only for you. Do not share this medicine with others. What if I miss a dose? If you miss a dose, take it as soon as you can. If it is almost time for your next dose, take only that dose. Do not take double or extra doses. What may interact with this medicine? Do not take this medicine with any of the following medications:  hymenoptera venom  sacubitril; valsartan This medicines may also interact with the following medications:  aliskiren  angiotensin receptor blockers, like losartan or valsartan  certain medicines for diabetes  diuretics  everolimus  gold compounds  lithium  NSAIDs, medicines for pain and inflammation, like ibuprofen or naproxen  potassium salts or supplements  salt substitutes  sirolimus  temsirolimus This list may not describe all possible interactions. Give your health care provider a list of all the medicines, herbs, non-prescription drugs, or dietary supplements you use. Also tell them if you smoke, drink alcohol, or use illegal drugs. Some items may interact with your medicine. What should I watch for while using this medicine? Visit your doctor or health care professional for regular check ups. Check your blood pressure as directed. Ask your doctor what your blood pressure should be, and when you should contact him or her. Do not  treat yourself for coughs, colds, or pain while you are using this medicine without asking your doctor or health care professional for advice. Some ingredients may increase your blood pressure. Women should inform their doctor if they wish to become pregnant or think they might be pregnant. There is a potential for serious side effects to an unborn child. Talk to your health care professional or pharmacist for more information. Check with your doctor or health care professional if you get an attack of severe diarrhea, nausea and vomiting, or if you sweat a lot. The loss of too much body fluid can make it dangerous for you to take this medicine. You may get drowsy or dizzy. Do not drive, use machinery, or do anything that needs mental alertness until you know how this drug affects you. Do not stand or sit up quickly, especially if you are an older patient. This reduces the risk of dizzy or fainting spells. Alcohol can make you more drowsy and dizzy. Avoid alcoholic drinks. Avoid salt substitutes unless you are told otherwise by your doctor or health care professional. What side effects may I notice from receiving this medicine? Side effects that you should report to your doctor or health care professional as soon as possible:  allergic reactions like skin rash, itching or hives, swelling of the hands, feet, face, lips, throat, or tongue  breathing problems  signs and symptoms of kidney injury like trouble passing urine or change in the amount of urine  signs and symptoms of increased potassium like muscle weakness; chest pain; or fast, irregular heartbeat  signs and symptoms of liver injury like dark yellow or brown urine; general ill feeling or flu-like symptoms; light-colored stools; loss of appetite; nausea; right upper belly  pain; unusually weak or tired; yellowing of the eyes or skin  signs and symptoms of low blood pressure like dizziness; feeling faint or lightheaded, falls; unusually weak or  tired  stomach pain with or without nausea and vomiting Side effects that usually do not require medical attention (report to your doctor or health care professional if they continue or are bothersome):  changes in taste  cough  dizziness  fever  headache  sensitivity to light This list may not describe all possible side effects. Call your doctor for medical advice about side effects. You may report side effects to FDA at 1-800-FDA-1088. Where should I keep my medicine? Keep out of the reach of children and pets. Store at room temperature between 20 and 25 degrees C (68 and 77 degrees F). Protect from moisture. Keep the container tightly closed. Do not freeze. Avoid exposure to extreme heat. Throw away any unused drug after the expiration date. NOTE: This sheet is a summary. It may not cover all possible information. If you have questions about this medicine, talk to your doctor, pharmacist, or health care provider.  2020 Elsevier/Gold Standard (2018-07-27 11:38:35)      Signed, Kate Sable, MD  08/31/2019 2:43 PM    [facility]

## 2019-09-04 ENCOUNTER — Ambulatory Visit: Payer: Medicaid Other | Admitting: Adult Health

## 2019-09-13 ENCOUNTER — Other Ambulatory Visit: Payer: Self-pay | Admitting: Adult Health

## 2019-09-13 DIAGNOSIS — F419 Anxiety disorder, unspecified: Secondary | ICD-10-CM

## 2019-09-13 MED ORDER — HYDROXYZINE HCL 50 MG PO TABS: 50.0000 mg | ORAL_TABLET | Freq: Three times a day (TID) | ORAL | 0 refills | Status: DC | PRN

## 2019-09-13 NOTE — Telephone Encounter (Signed)
Medication Refill - Medication: hydrOXYzine (ATARAX/VISTARIL) 50 MG tablet [[id number]]     Preferred Pharmacy (with phone number or street name):  [id number] [address], [place]  [place] [address]  Phone: [phone] Fax: [phone]  Hours: Not open 24 hours     Agent: Please be advised that RX refills may take up to 3 business days. We ask that you follow-up with your pharmacy.

## 2019-09-18 ENCOUNTER — Ambulatory Visit: Payer: Medicaid Other | Admitting: Adult Health

## 2019-09-26 ENCOUNTER — Other Ambulatory Visit: Payer: Self-pay

## 2019-09-26 ENCOUNTER — Encounter: Payer: Self-pay | Admitting: Adult Health

## 2019-09-26 ENCOUNTER — Ambulatory Visit (INDEPENDENT_AMBULATORY_CARE_PROVIDER_SITE_OTHER): Payer: Self-pay | Admitting: Adult Health

## 2019-09-26 VITALS — BP 131/76 | HR 79 | Temp 98.8°F | Resp 15 | Wt 144.2 lb

## 2019-09-26 DIAGNOSIS — I10 Essential (primary) hypertension: Secondary | ICD-10-CM

## 2019-09-26 DIAGNOSIS — E785 Hyperlipidemia, unspecified: Secondary | ICD-10-CM

## 2019-09-26 DIAGNOSIS — F419 Anxiety disorder, unspecified: Secondary | ICD-10-CM

## 2019-09-26 MED ORDER — ROSUVASTATIN CALCIUM 5 MG PO TABS
5.0000 mg | ORAL_TABLET | Freq: Every day | ORAL | 1 refills | Status: DC
Start: 2019-09-26 — End: 2020-04-09

## 2019-09-26 MED ORDER — HYDROCHLOROTHIAZIDE 25 MG PO TABS: 25.0000 mg | ORAL_TABLET | Freq: Every day | ORAL | 3 refills | Status: DC

## 2019-09-26 MED ORDER — HYDROXYZINE HCL 50 MG PO TABS: 50.0000 mg | ORAL_TABLET | Freq: Three times a day (TID) | ORAL | 2 refills | Status: DC | PRN

## 2019-09-26 MED ORDER — BUPROPION HCL ER (SR) 150 MG PO TB12
150.0000 mg | ORAL_TABLET | Freq: Two times a day (BID) | ORAL | 0 refills | Status: DC
Start: 2019-09-26 — End: 2019-12-28

## 2019-09-26 NOTE — Patient Instructions (Addendum)
Bupropion tablets (Depression/Mood Disorders) What is this medicine? BUPROPION (byoo PROE pee on) is used to treat depression. This medicine may be used for other purposes; ask your health care provider or pharmacist if you have questions. COMMON BRAND NAME(S): Wellbutrin What should I tell my health care provider before I take this medicine? They need to know if you have any of these conditions:  an eating disorder, such as anorexia or bulimia  bipolar disorder or psychosis  diabetes or high blood sugar, treated with medication  glaucoma  heart disease, previous heart attack, or irregular heart beat  head injury or brain tumor  high blood pressure  kidney or liver disease  seizures  suicidal thoughts or a previous suicide attempt  Tourette's syndrome  weight loss  an unusual or allergic reaction to bupropion, other medicines, foods, dyes, or preservatives  breast-feeding  pregnant or trying to become pregnant How should I use this medicine? Take this medicine by mouth with a glass of water. Follow the directions on the prescription label. You can take it with or without food. If it upsets your stomach, take it with food. Take your medicine at regular intervals. Do not take your medicine more often than directed. Do not stop taking this medicine suddenly except upon the advice of your doctor. Stopping this medicine too quickly may cause serious side effects or your condition may worsen. A special MedGuide will be given to you by the pharmacist with each prescription and refill. Be sure to read this information carefully each time. Talk to your pediatrician regarding the use of this medicine in children. Special care may be needed. Overdosage: If you think you have taken too much of this medicine contact a poison control center or emergency room at once. NOTE: This medicine is only for you. Do not share this medicine with others. What if I miss a dose? If you miss a dose,  take it as soon as you can. If it is less than four hours to your next dose, take only that dose and skip the missed dose. Do not take double or extra doses. What may interact with this medicine? Do not take this medicine with any of the following medications:  linezolid  MAOIs like Azilect, Carbex, Eldepryl, Marplan, Nardil, and Parnate  methylene blue (injected into a vein)  other medicines that contain bupropion like Zyban This medicine may also interact with the following medications:  alcohol  certain medicines for anxiety or sleep  certain medicines for blood pressure like metoprolol, propranolol  certain medicines for depression or psychotic disturbances  certain medicines for HIV or AIDS like efavirenz, lopinavir, nelfinavir, ritonavir  certain medicines for irregular heart beat like propafenone, flecainide  certain medicines for Parkinson's disease like amantadine, levodopa  certain medicines for seizures like carbamazepine, phenytoin, phenobarbital  cimetidine  clopidogrel  cyclophosphamide  digoxin  furazolidone  isoniazid  nicotine  orphenadrine  procarbazine  steroid medicines like prednisone or cortisone  stimulant medicines for attention disorders, weight loss, or to stay awake  tamoxifen  theophylline  thiotepa  ticlopidine  tramadol  warfarin This list may not describe all possible interactions. Give your health care provider a list of all the medicines, herbs, non-prescription drugs, or dietary supplements you use. Also tell them if you smoke, drink alcohol, or use illegal drugs. Some items may interact with your medicine. What should I watch for while using this medicine? Tell your doctor if your symptoms do not get better or if they get worse.   Visit your doctor or healthcare provider for regular checks on your progress. Because it may take several weeks to see the full effects of this medicine, it is important to continue your  treatment as prescribed by your doctor. This medicine may cause serious skin reactions. They can happen weeks to months after starting the medicine. Contact your healthcare provider right away if you notice fevers or flu-like symptoms with a rash. The rash may be red or purple and then turn into blisters or peeling of the skin. Or, you might notice a red rash with swelling of the face, lips or lymph nodes in your neck or under your arms. Patients and their families should watch out for new or worsening thoughts of suicide or depression. Also watch out for sudden changes in feelings such as feeling anxious, agitated, panicky, irritable, hostile, aggressive, impulsive, severely restless, overly excited and hyperactive, or not being able to sleep. If this happens, especially at the beginning of treatment or after a change in dose, call your healthcare provider. Avoid alcoholic drinks while taking this medicine. Drinking excessive alcoholic beverages, using sleeping or anxiety medicines, or quickly stopping the use of these agents while taking this medicine may increase your risk for a seizure. Do not drive or use heavy machinery until you know how this medicine affects you. This medicine can impair your ability to perform these tasks. Do not take this medicine close to bedtime. It may prevent you from sleeping. Your mouth may get dry. Chewing sugarless gum or sucking hard candy, and drinking plenty of water may help. Contact your doctor if the problem does not go away or is severe. What side effects may I notice from receiving this medicine? Side effects that you should report to your doctor or health care professional as soon as possible:  allergic reactions like skin rash, itching or hives, swelling of the face, lips, or tongue  breathing problems  changes in vision  confusion  elevated mood, decreased need for sleep, racing thoughts, impulsive behavior  fast or irregular  heartbeat  hallucinations, loss of contact with reality  increased blood pressure  rash, fever, and swollen lymph nodes  redness, blistering, peeling, or loosening of the skin, including inside the mouth  seizures  suicidal thoughts or other mood changes  unusually weak or tired  vomiting Side effects that usually do not require medical attention (report to your doctor or health care professional if they continue or are bothersome):  constipation  headache  loss of appetite  nausea  tremors  weight loss This list may not describe all possible side effects. Call your doctor for medical advice about side effects. You may report side effects to FDA at 1-800-FDA-1088. Where should I keep my medicine? Keep out of the reach of children. Store at room temperature between 20 and 25 degrees C (68 and 77 degrees F), away from direct sunlight and moisture. Keep tightly closed. Throw away any unused medicine after the expiration date. NOTE: This sheet is a summary. It may not cover all possible information. If you have questions about this medicine, talk to your doctor, pharmacist, or health care provider.  2020 Elsevier/Gold Standard (2018-03-17 14:02:47) Preventing High Cholesterol Cholesterol is a white, waxy substance similar to fat that the human body needs to help build cells. The liver makes all the cholesterol that a person's body needs. Having high cholesterol (hypercholesterolemia) increases a person's risk for heart disease and stroke. Extra (excess) cholesterol comes from the food the person eats.  High cholesterol can often be prevented with diet and lifestyle changes. If you already have high cholesterol, you can control it with diet and lifestyle changes and with medicine. How can high cholesterol affect me? If you have high cholesterol, deposits (plaques) may build up on the walls of your arteries. The arteries are the blood vessels that carry blood away from your  heart. Plaques make the arteries narrower and stiffer. This can limit or block blood flow and cause blood clots to form. Blood clots:  Are tiny balls of cells that form in your blood.  Can move to the heart or brain, causing a heart attack or stroke. Plaques in arteries greatly increase your risk for heart attack and stroke.Making diet and lifestyle changes can reduce your risk for these conditions that may threaten your life. What can increase my risk? This condition is more likely to develop in people who:  Eat foods that are high in saturated fat or cholesterol. Saturated fat is mostly found in: ? Foods that contain animal fat, such as red meat and some dairy products. ? Certain fatty foods made from plants, such as tropical oils.  Are overweight.  Are not getting enough exercise.  Have a family history of high cholesterol. What actions can I take to prevent this? Nutrition   Eat less saturated fat.  Avoid trans fats (partially hydrogenated oils). These are often found in margarine and in some baked goods, fried foods, and snacks bought in packages.  Avoid precooked or cured meat, such as sausages or meat loaves.  Avoid foods and drinks that have added sugars.  Eat more fruits, vegetables, and whole grains.  Choose healthy sources of protein, such as fish, poultry, lean cuts of red meat, beans, peas, lentils, and nuts.  Choose healthy sources of fat, such as: ? Nuts. ? Vegetable oils, especially olive oil. ? Fish that have healthy fats (omega-3 fatty acids), such as mackerel or salmon. The items listed above may not be a complete list of recommended foods and beverages. Contact a dietitian for more information. Lifestyle  Lose weight if you are overweight. Losing 5-10 lb (2.3-4.5 kg) can help prevent or control high cholesterol. It can also lower your risk for diabetes and high blood pressure. Ask your health care provider to help you with a diet and exercise plan to lose  weight safely.  Do not use any products that contain nicotine or tobacco, such as cigarettes, e-cigarettes, and chewing tobacco. If you need help quitting, ask your health care provider.  Limit your alcohol intake. ? Do not drink alcohol if:  Your health care provider tells you not to drink.  You are pregnant, may be pregnant, or are planning to become pregnant. ? If you drink alcohol:  Limit how much you use to:  0-1 drink a day for women.  0-2 drinks a day for men.  Be aware of how much alcohol is in your drink. In the [place] one drink equals one 12 oz bottle of beer (355 mL), one 5 oz glass of wine (148 mL), or one 1 oz glass of hard liquor (44 mL). Activity   Get enough exercise. Each week, do at least 150 minutes of exercise that takes a medium level of effort (moderate-intensity exercise). ? This is exercise that:  Makes your heart beat faster and makes you breathe harder than usual.  Allows you to still be able to talk. ? You could exercise in short sessions several times a day or longer  sessions a few times a week. For example, on 5 days each week, you could walk fast or ride your bike 3 times a day for 10 minutes each time.  Do exercises as told by your health care provider. Medicines  In addition to diet and lifestyle changes, your health care provider may recommend medicines to help lower cholesterol. This may be a medicine to lower the amount of cholesterol your liver makes. You may need medicine if: ? Diet and lifestyle changes do not lower your cholesterol enough. ? You have high cholesterol and other risk factors for heart disease or stroke.  Take over-the-counter and prescription medicines only as told by your health care provider. General information  Manage your risk factors for high cholesterol. Talk with your health care provider about all your risk factors and how to lower your risk.  Manage other conditions that you have, such as diabetes or high blood  pressure (hypertension).  Have blood tests to check your cholesterol levels at regular points in time as told by your health care provider.  Keep all follow-up visits as told by your health care provider. This is important. Where to find more information  American Heart Association: www.heart.org  National Heart, Lung, and Blood Institute: https://wilson-eaton.com/ Summary  High cholesterol increases your risk for heart disease and stroke. By keeping your cholesterol level low, you can reduce your risk for these conditions.  High cholesterol can often be prevented with diet and lifestyle changes.  Work with your health care provider to manage your risk factors, and have your blood tested regularly. This information is not intended to replace advice given to you by your health care provider. Make sure you discuss any questions you have with your health care provider. Document Revised: 04/15/2018 Document Reviewed: 08/31/2015 Elsevier Patient Education  [place]. Rosuvastatin Tablets What is this medicine? ROSUVASTATIN (roe SOO va sta tin) is known as a HMG-CoA reductase inhibitor or 'statin'. It lowers cholesterol and triglycerides in the blood. This drug may also reduce the risk of heart attack, stroke, or other health problems in patients with risk factors for heart disease. Diet and lifestyle changes are often used with this drug. This medicine may be used for other purposes; ask your health care provider or pharmacist if you have questions. COMMON BRAND NAME(S): Crestor What should I tell my health care provider before I take this medicine? They need to know if you have any of these conditions:  diabetes  if you often drink alcohol  history of stroke  kidney disease  liver disease  muscle aches or weakness  thyroid disease  an unusual or allergic reaction to rosuvastatin, other medicines, foods, dyes, or preservatives  pregnant or trying to get  pregnant  breast-feeding How should I use this medicine? Take this medicine by mouth with a glass of water. Follow the directions on the prescription label. Do not cut, crush or chew this medicine. You can take this medicine with or without food. Take your doses at regular intervals. Do not take your medicine more often than directed. Talk to your pediatrician regarding the use of this medicine in children. While this drug may be prescribed for children as young as 22 years old for selected conditions, precautions do apply. Overdosage: If you think you have taken too much of this medicine contact a poison control center or emergency room at once. NOTE: This medicine is only for you. Do not share this medicine with others. What if I miss a  dose? If you miss a dose, take it as soon as you can. If your next dose is to be taken in less than 12 hours, then do not take the missed dose. Take the next dose at your regular time. Do not take double or extra doses. What may interact with this medicine? Do not take this medicine with any of the following medications:  herbal medicines like red yeast rice This medicine may also interact with the following medications:  alcohol  antacids containing aluminum hydroxide or magnesium hydroxide  cyclosporine  other medicines for high cholesterol  some medicines for HIV infection  warfarin This list may not describe all possible interactions. Give your health care provider a list of all the medicines, herbs, non-prescription drugs, or dietary supplements you use. Also tell them if you smoke, drink alcohol, or use illegal drugs. Some items may interact with your medicine. What should I watch for while using this medicine? Visit your doctor or health care professional for regular check-ups. You may need regular tests to make sure your liver is working properly. Your health care professional may tell you to stop taking this medicine if you develop muscle  problems. If your muscle problems do not go away after stopping this medicine, contact your health care professional. Do not become pregnant while taking this medicine. Women should inform their health care professional if they wish to become pregnant or think they might be pregnant. There is a potential for serious side effects to an unborn child. Talk to your health care professional or pharmacist for more information. Do not breast-feed an infant while taking this medicine. This medicine may increase blood sugar. Ask your healthcare provider if changes in diet or medicines are needed if you have diabetes. If you are going to need surgery or other procedure, tell your doctor that you are using this medicine. This drug is only part of a total heart-health program. Your doctor or a dietician can suggest a low-cholesterol and low-fat diet to help. Avoid alcohol and smoking, and keep a proper exercise schedule. This medicine may cause a decrease in Co-Enzyme Q-10. You should make sure that you get enough Co-Enzyme Q-10 while you are taking this medicine. Discuss the foods you eat and the vitamins you take with your health care professional. What side effects may I notice from receiving this medicine? Side effects that you should report to your doctor or health care professional as soon as possible:  allergic reactions like skin rash, itching or hives, swelling of the face, lips, or tongue  confusion  joint pain  loss of memory  redness, blistering, peeling or loosening of the skin, including inside the mouth  signs and symptoms of high blood sugar such as being more thirsty or hungry or having to urinate more than normal. You may also feel very tired or have blurry vision.  signs and symptoms of muscle injury like dark urine; trouble passing urine or change in the amount of urine; unusually weak or tired; muscle pain or side or back pain  yellowing of the eyes or skin Side effects that usually  do not require medical attention (report to your doctor or health care professional if they continue or are bothersome):  constipation  diarrhea  dizziness  gas  headache  nausea  stomach pain  trouble sleeping  upset stomach This list may not describe all possible side effects. Call your doctor for medical advice about side effects. You may report side effects to FDA at  1-800-FDA-1088. Where should I keep my medicine? Keep out of the reach of children. Store at room temperature between 20 and 25 degrees C (68 and 77 degrees F). Keep container tightly closed (protect from moisture). Throw away any unused medicine after the expiration date. NOTE: This sheet is a summary. It may not cover all possible information. If you have questions about this medicine, talk to your doctor, pharmacist, or health care provider.  2020 Elsevier/Gold Standard (2017-10-14 08:25:08) Lisinopril Tablets What is this medicine? LISINOPRIL (lyse IN oh pril) is an ACE inhibitor. It treats high blood pressure and heart failure. It can treat heart damage after a heart attack. This medicine may be used for other purposes; ask your health care provider or pharmacist if you have questions. COMMON BRAND NAME(S): Prinivil, Zestril What should I tell my health care provider before I take this medicine? They need to know if you have any of these conditions:  diabetes  heart or blood vessel disease  kidney disease  low blood pressure  previous swelling of the tongue, face, or lips with difficulty breathing, difficulty swallowing, hoarseness, or tightening of the throat  an unusual or allergic reaction to lisinopril, other ACE inhibitors, insect venom, foods, dyes, or preservatives  pregnant or trying to get pregnant  breast-feeding How should I use this medicine? Take this drug by mouth. Take it as directed on the prescription label at the same time every day. You can take it with or without food. If it  upsets your stomach, take it with food. Keep taking it unless your health care provider tells you to stop. Talk to your health care provider about the use of this drug in children. While it may be prescribed for children as young as 6 for selected conditions, precautions do apply. Overdosage: If you think you have taken too much of this medicine contact a poison control center or emergency room at once. NOTE: This medicine is only for you. Do not share this medicine with others. What if I miss a dose? If you miss a dose, take it as soon as you can. If it is almost time for your next dose, take only that dose. Do not take double or extra doses. What may interact with this medicine? Do not take this medicine with any of the following medications:  hymenoptera venom  sacubitril; valsartan This medicines may also interact with the following medications:  aliskiren  angiotensin receptor blockers, like losartan or valsartan  certain medicines for diabetes  diuretics  everolimus  gold compounds  lithium  NSAIDs, medicines for pain and inflammation, like ibuprofen or naproxen  potassium salts or supplements  salt substitutes  sirolimus  temsirolimus This list may not describe all possible interactions. Give your health care provider a list of all the medicines, herbs, non-prescription drugs, or dietary supplements you use. Also tell them if you smoke, drink alcohol, or use illegal drugs. Some items may interact with your medicine. What should I watch for while using this medicine? Visit your doctor or health care professional for regular check ups. Check your blood pressure as directed. Ask your doctor what your blood pressure should be, and when you should contact him or her. Do not treat yourself for coughs, colds, or pain while you are using this medicine without asking your doctor or health care professional for advice. Some ingredients may increase your blood pressure. Women  should inform their doctor if they wish to become pregnant or think they might be pregnant. There  is a potential for serious side effects to an unborn child. Talk to your health care professional or pharmacist for more information. Check with your doctor or health care professional if you get an attack of severe diarrhea, nausea and vomiting, or if you sweat a lot. The loss of too much body fluid can make it dangerous for you to take this medicine. You may get drowsy or dizzy. Do not drive, use machinery, or do anything that needs mental alertness until you know how this drug affects you. Do not stand or sit up quickly, especially if you are an older patient. This reduces the risk of dizzy or fainting spells. Alcohol can make you more drowsy and dizzy. Avoid alcoholic drinks. Avoid salt substitutes unless you are told otherwise by your doctor or health care professional. What side effects may I notice from receiving this medicine? Side effects that you should report to your doctor or health care professional as soon as possible:  allergic reactions like skin rash, itching or hives, swelling of the hands, feet, face, lips, throat, or tongue  breathing problems  signs and symptoms of kidney injury like trouble passing urine or change in the amount of urine  signs and symptoms of increased potassium like muscle weakness; chest pain; or fast, irregular heartbeat  signs and symptoms of liver injury like dark yellow or brown urine; general ill feeling or flu-like symptoms; light-colored stools; loss of appetite; nausea; right upper belly pain; unusually weak or tired; yellowing of the eyes or skin  signs and symptoms of low blood pressure like dizziness; feeling faint or lightheaded, falls; unusually weak or tired  stomach pain with or without nausea and vomiting Side effects that usually do not require medical attention (report to your doctor or health care professional if they continue or are  bothersome):  changes in taste  cough  dizziness  fever  headache  sensitivity to light This list may not describe all possible side effects. Call your doctor for medical advice about side effects. You may report side effects to FDA at 1-800-FDA-1088. Where should I keep my medicine? Keep out of the reach of children and pets. Store at room temperature between 20 and 25 degrees C (68 and 77 degrees F). Protect from moisture. Keep the container tightly closed. Do not freeze. Avoid exposure to extreme heat. Throw away any unused drug after the expiration date. NOTE: This sheet is a summary. It may not cover all possible information. If you have questions about this medicine, talk to your doctor, pharmacist, or health care provider.  2020 Elsevier/Gold Standard (2018-07-27 11:38:35)

## 2019-09-26 NOTE — Progress Notes (Signed)
Established patient visit   Patient: Ashley Massey   DOB: 10-Apr-1975   44 y.o. Female  MRN: [id number] Visit Date: 09/26/2019  Today's healthcare provider: Marcille Buffy, FNP   Chief Complaint  Patient presents with  . Hypertension   Subjective    HPI  Hypertension, follow-up  BP Readings from Last 3 Encounters:  09/26/19 131/76  08/31/19 (!) 170/110  07/07/19 (!) 148/86   Wt Readings from Last 3 Encounters:  09/26/19 144 lb 3.2 oz (65.4 kg)  08/31/19 138 lb 8 oz (62.8 kg)  07/07/19 134 lb (60.8 kg)     She was last seen for hypertension 2 months ago.  BP at that visit was 148/86. Management since that visit include the addition of lisinopril 5mg . She is tolerating without any unwanted side effects.   She desires to start on lipid management for her elevated LDL and total cholesterol, she has a family history of hyperlipidemia in both parents and feels she watches her diet relatively well.  She could increase exercise.  She reports excellent compliance with treatment. She is not having side effects She is following a Regular diet. She is not exercising. She does not smoke.  Use of agents associated with hypertension: none.  No chest pain since last visit. She did see cardiology for follow up as well.   Outside blood pressures are patient reports that blood pressure has been around 140/90. Symptoms: No chest pain No chest pressure  No palpitations No syncope  No dyspnea No orthopnea  No paroxysmal nocturnal dyspnea No lower extremity edema    She stopped Wellbutrin XL due to cost over two weeks ago and desires to restart. Denies any suicidal or homicidal ideations or intents. She felt much better on the Wellbutrin.  Pertinent labs: Lab Results  Component Value Date   CHOL 248 (H) 07/17/2019   HDL 70 07/17/2019   LDLCALC 163 (H) 07/17/2019   TRIG 89 07/17/2019   CHOLHDL 3.5 07/17/2019   Lab Results  Component Value Date   NA 137 07/17/2019    K 4.0 07/17/2019   CREATININE 0.89 07/17/2019   GFRNONAA 81 07/17/2019   GFRAA 93 07/17/2019   GLUCOSE 99 07/17/2019     The 10-year ASCVD risk score Mikey Bussing DC Jr., et al., 2013) is: 0.8%   ---------------------------------------------------------------------------------------------------  Patient Active Problem List   Diagnosis Date Noted  . History of chest pain 07/11/2019  . Colicky RUQ abdominal pain 07/11/2019  . Elevated SGOT (AST) 07/11/2019  . Active labor 01/25/2012  . GBS (group B Streptococcus carrier), +RV culture, currently pregnant 01/25/2012   Past Medical History:  Diagnosis Date  . Hypertension    Family History  Problem Relation Age of Onset  . Hypertension Mother   . Hypertension Father   . Healthy Sister   . Hypertension Maternal Grandmother   . Hypertension Maternal Grandfather   . Heart disease Paternal Grandmother   . Hypertension Paternal Grandmother   . Hypertension Paternal Grandfather    No Known Allergies     Medications: Outpatient Medications Prior to Visit  Medication Sig  . lisinopril (ZESTRIL) 5 MG tablet Take 1 tablet (5 mg total) by mouth daily.  . medroxyPROGESTERone (DEPO-PROVERA) 150 MG/ML injection Inject 150 mg into the muscle every 3 (three) months.  . [DISCONTINUED] hydrochlorothiazide (HYDRODIURIL) 25 MG tablet Take 1 tablet (25 mg total) by mouth daily.  . [DISCONTINUED] hydrOXYzine (ATARAX/VISTARIL) 50 MG tablet Take 1 tablet (50 mg total) by mouth  every 8 (eight) hours as needed for anxiety (may causes drowsiness, No driving with medication or legal decisions).  . [DISCONTINUED] buPROPion (WELLBUTRIN XL) 150 MG 24 hr tablet TAKE 1 TABLET BY MOUTH EVERY DAY   No facility-administered medications prior to visit.    Review of Systems  Constitutional: Negative.   HENT: Negative.   Eyes: Negative.   Respiratory: Negative.   Cardiovascular: Negative.   Gastrointestinal: Negative.   Endocrine: Negative.   Genitourinary:  Negative.   Musculoskeletal: Negative.   Skin: Negative.   Allergic/Immunologic: Negative.   Neurological: Negative.   Hematological: Negative.   Psychiatric/Behavioral: Negative for agitation, behavioral problems, confusion, decreased concentration, dysphoric mood, hallucinations, self-injury, sleep disturbance and suicidal ideas. The patient is nervous/anxious. The patient is not hyperactive.       Objective    BP 131/76   Pulse 79   Temp 98.8 F (37.1 C) (Oral)   Resp 15   Wt 144 lb 3.2 oz (65.4 kg)   SpO2 99%   BMI 25.54 kg/m    Physical Exam Constitutional:      General: She is not in acute distress.    Appearance: She is not ill-appearing, toxic-appearing or diaphoretic.  HENT:     Head: Normocephalic and atraumatic.     Right Ear: External ear normal.     Left Ear: External ear normal.     Nose: Nose normal.     Mouth/Throat:     Mouth: Mucous membranes are moist.  Eyes:     Pupils: Pupils are equal, round, and reactive to light.  Cardiovascular:     Rate and Rhythm: Normal rate and regular rhythm.     Pulses: Normal pulses.     Heart sounds: Normal heart sounds. No murmur heard.  No friction rub. No gallop.   Pulmonary:     Effort: Pulmonary effort is normal. No respiratory distress.     Breath sounds: Normal breath sounds. No stridor. No wheezing, rhonchi or rales.  Chest:     Chest wall: No tenderness.  Abdominal:     General: There is no distension.     Palpations: Abdomen is soft. There is no mass.     Tenderness: There is no abdominal tenderness.     Hernia: No hernia is present.  Musculoskeletal:        General: Normal range of motion.     Cervical back: Normal range of motion and neck supple.  Lymphadenopathy:     Cervical: No cervical adenopathy.  Skin:    General: Skin is warm and dry.     Capillary Refill: Capillary refill takes less than 2 seconds.     Findings: No rash.  Neurological:     Mental Status: She is alert and oriented to  person, place, and time.  Psychiatric:        Mood and Affect: Mood normal.        Behavior: Behavior normal.        Thought Content: Thought content normal.        Judgment: Judgment normal.      No results found for any visits on 09/26/19.  Assessment & Plan     1. Anxiety  Meds ordered this encounter  Medications  . buPROPion (WELLBUTRIN SR) 150 MG 12 hr tablet    Sig: Take 1 tablet (150 mg total) by mouth 2 (two) times daily.    Dispense:  60 tablet    Refill:  0  . hydrochlorothiazide (HYDRODIURIL) 25 MG  tablet    Sig: Take 1 tablet (25 mg total) by mouth daily.    Dispense:  90 tablet    Refill:  3  . hydrOXYzine (ATARAX/VISTARIL) 50 MG tablet    Sig: Take 1 tablet (50 mg total) by mouth every 8 (eight) hours as needed for anxiety (may causes drowsiness, No driving with medication or legal decisions).    Dispense:  60 tablet    Refill:  2  . rosuvastatin (CRESTOR) 5 MG tablet    Sig: Take 1 tablet (5 mg total) by mouth daily.    Dispense:  90 tablet    Refill:  1   - hydrOXYzine (ATARAX/VISTARIL) 50 MG tablet; Take 1 tablet (50 mg total) by mouth every 8 (eight) hours as needed for anxiety (may causes drowsiness, No driving with medication or legal decisions).  Dispense: 60 tablet; Refill: 2  2. Hyperlipidemia, unspecified hyperlipidemia type  Check labs today/ tomorrow. recheck CMP  in 6 to 8 weeks after starting Crestor. Discussed possible side effects of medication and when to call or return to clinic or seek care immediately.   The patient is advised to begin progressive daily aerobic exercise program, follow a low fat, low cholesterol diet, attempt to lose weight, improve dietary compliance, continue current medications, continue current healthy lifestyle patterns and return for routine annual checkups. - CBC with Differential/Platelet - Comprehensive Metabolic Panel (CMET)  3. Hypertension, unspecified type Continue current medication regimen.   Advised  patient call the office or your primary care doctor for an appointment if no improvement within 72 hours or if any symptoms change or worsen at any time  Advised ER or urgent Care if after hours or on weekend. Call 911 for emergency symptoms at any time.Patinet verbalized understanding of all instructions given/reviewed and treatment plan and has no further questions or concerns at this time.    Addressed chronic medical problems today requiring 33  minutes reviewing patients medical record,labs, counseling patient regarding patient's conditions, any medications, answering questions regarding health, and coordination of care as needed. After visit summary patient given copy and reviewed.   Return in about 3 months (around 12/26/2019), or if symptoms worsen or fail to improve, for at any time for any worsening symptoms, Go to Emergency room/ urgent care if worse.     Marcille Buffy, [place] [phone] (phone) [phone] (fax)  [place]

## 2019-09-28 ENCOUNTER — Telehealth: Payer: Self-pay | Admitting: Adult Health

## 2019-09-28 NOTE — Telephone Encounter (Signed)
Dentist faxed refill request for the following medications:  buPROPion (WELLBUTRIN SR) 150 MG 12 hr tablet hydrochlorothiazide (HYDRODIURIL) 25 MG tablet rosuvastatin (CRESTOR) 5 MG tablet hydrOXYzine (ATARAX/VISTARIL) 50 MG tablet  Last Rx: 09/26/2019 sent to local pharmacy LOV: 09/26/2019 Please advise. Thanks TNP

## 2019-09-29 ENCOUNTER — Other Ambulatory Visit: Payer: Self-pay | Admitting: Adult Health

## 2019-09-29 DIAGNOSIS — F419 Anxiety disorder, unspecified: Secondary | ICD-10-CM

## 2019-09-29 NOTE — Telephone Encounter (Signed)
You can call and clarify with patient - if still at [facility] and she wants this ok to approve and cancel script at [facility]

## 2019-09-29 NOTE — Telephone Encounter (Signed)
Medication Refill - Medication: buPROPion (WELLBUTRIN SR) 150 MG 12 hr tablet hydrochlorothiazide (HYDRODIURIL) 25 MG tablet rosuvastatin (CRESTOR) 5 MG tablet hydrOXYzine (ATARAX/VISTARIL) 50 MG tablet (Pharmacy  Called requesting 6 month supply of medications be sent over to pharmacy.)  Has the patient contacted their pharmacy?yes (Agent: If no, request that the patient contact the pharmacy for the refill.) (Agent: If yes, when and what did the pharmacy advise?)Contact PCP  Preferred Pharmacy (with phone number or street name):  [place], [place] Phone:  [phone]  Fax:  [phone]       Agent: Please be advised that RX refills may take up to 3 business days. We ask that you follow-up with your pharmacy.

## 2019-09-29 NOTE — Telephone Encounter (Signed)
This was just sent 09/26/19 to [facility] pharmacy, do we disregard this request? KW

## 2019-10-02 NOTE — Telephone Encounter (Signed)
Left message to callback for clarification since prescriptions were sent into [facility] 09/26/19. KW

## 2019-10-08 ENCOUNTER — Other Ambulatory Visit: Payer: Self-pay | Admitting: Adult Health

## 2019-11-06 ENCOUNTER — Ambulatory Visit: Payer: Self-pay | Admitting: Cardiology

## 2019-11-07 ENCOUNTER — Encounter: Payer: Self-pay | Admitting: Cardiology

## 2019-11-08 ENCOUNTER — Other Ambulatory Visit: Payer: Self-pay | Admitting: Adult Health

## 2019-11-08 DIAGNOSIS — F419 Anxiety disorder, unspecified: Secondary | ICD-10-CM

## 2019-11-22 DIAGNOSIS — Z3042 Encounter for surveillance of injectable contraceptive: Secondary | ICD-10-CM | POA: Diagnosis not present

## 2019-12-26 ENCOUNTER — Ambulatory Visit (INDEPENDENT_AMBULATORY_CARE_PROVIDER_SITE_OTHER): Payer: Medicaid Other | Admitting: Adult Health

## 2019-12-26 DIAGNOSIS — Z5329 Procedure and treatment not carried out because of patient's decision for other reasons: Secondary | ICD-10-CM

## 2019-12-27 ENCOUNTER — Other Ambulatory Visit: Payer: Self-pay | Admitting: Adult Health

## 2019-12-27 NOTE — Telephone Encounter (Signed)
Requested medications are due for refill today yes  Requested medications are on the active medication list yes  Last refill 9/29  Last visit 09/2019  Future visit scheduled No, No Show for visit 12/21  Notes to clinic passes protocol but OV note states to return in 3 months.

## 2020-01-02 DIAGNOSIS — Z91199 Patient's noncompliance with other medical treatment and regimen due to unspecified reason: Secondary | ICD-10-CM | POA: Insufficient documentation

## 2020-01-02 NOTE — Progress Notes (Signed)
No show

## 2020-02-13 DIAGNOSIS — Z3042 Encounter for surveillance of injectable contraceptive: Secondary | ICD-10-CM | POA: Diagnosis not present

## 2020-02-27 ENCOUNTER — Telehealth: Payer: Self-pay | Admitting: Cardiology

## 2020-02-27 NOTE — Telephone Encounter (Signed)
Attempted to schedule.  LMOV to call office.  ° °

## 2020-02-27 NOTE — Telephone Encounter (Signed)
-----   Message from [place] Finer sent at 02/27/2020  2:10 PM EST ----- Regarding: needs appointment Please schedule 2 month F/U appointment. Patient did not show for last schedule visit. Thank you!

## 2020-02-29 NOTE — Telephone Encounter (Signed)
Left message to schedule for patient after pharmacy called to check on status of medication refill. Pharmacy was made aware patient is overdue for followup and needs to schedule. Pharmacy said they would make patient aware as well

## 2020-03-05 DIAGNOSIS — Z419 Encounter for procedure for purposes other than remedying health state, unspecified: Secondary | ICD-10-CM | POA: Diagnosis not present

## 2020-03-21 NOTE — Telephone Encounter (Signed)
LMOV  

## 2020-04-05 DIAGNOSIS — Z419 Encounter for procedure for purposes other than remedying health state, unspecified: Secondary | ICD-10-CM | POA: Diagnosis not present

## 2020-04-09 ENCOUNTER — Other Ambulatory Visit: Payer: Self-pay | Admitting: Adult Health

## 2020-04-09 NOTE — Telephone Encounter (Signed)
Requested medication (s) are due for refill today: no  Requested medication (s) are on the active medication list: yes   Last refill: 03/31/2020  Future visit scheduled:  yes   Notes to clinic:  patient has upcoming on 04/15/2020   Requested Prescriptions  Pending Prescriptions Disp Refills   rosuvastatin (CRESTOR) 5 MG tablet [Pharmacy Med Name: Rosuvastatin Calcium 5mg  Tablet] 90 tablet 0    Sig: Take 1 tablet by mouth once daily.      Cardiovascular:  Antilipid - Statins Failed - 04/09/2020 12:48 PM      Failed - Total Cholesterol in normal range and within 360 days    Cholesterol, Total  Date Value Ref Range Status  07/17/2019 248 (H) 100 - 199 mg/dL Final          Failed - LDL in normal range and within 360 days    LDL Chol Calc (NIH)  Date Value Ref Range Status  07/17/2019 163 (H) 0 - 99 mg/dL Final          Passed - HDL in normal range and within 360 days    HDL  Date Value Ref Range Status  07/17/2019 70 >39 mg/dL Final          Passed - Triglycerides in normal range and within 360 days    Triglycerides  Date Value Ref Range Status  07/17/2019 89 0 - 149 mg/dL Final          Passed - Patient is not pregnant      Passed - Valid encounter within last 12 months    Recent Outpatient Visits           3 months ago No-show for appointment- 12/26/2019   [facility] Flinchum, Kelby Aline, FNP   6 months ago [place] Flinchum, Kelby Aline, FNP   9 months ago [place], Kelby Aline, FNP       Future Appointments             In 6 days Flinchum, Kelby Aline, [place], [place]

## 2020-04-15 ENCOUNTER — Encounter: Payer: Self-pay | Admitting: Adult Health

## 2020-04-15 ENCOUNTER — Ambulatory Visit (INDEPENDENT_AMBULATORY_CARE_PROVIDER_SITE_OTHER): Payer: Medicaid Other | Admitting: Adult Health

## 2020-04-15 ENCOUNTER — Other Ambulatory Visit: Payer: Self-pay

## 2020-04-15 ENCOUNTER — Other Ambulatory Visit: Payer: Self-pay | Admitting: Adult Health

## 2020-04-15 VITALS — BP 152/86 | HR 74 | Resp 16 | Wt 141.0 lb

## 2020-04-15 DIAGNOSIS — I10 Essential (primary) hypertension: Secondary | ICD-10-CM | POA: Diagnosis not present

## 2020-04-15 DIAGNOSIS — F419 Anxiety disorder, unspecified: Secondary | ICD-10-CM

## 2020-04-15 MED ORDER — ESCITALOPRAM OXALATE 5 MG PO TABS: 5.0000 mg | ORAL_TABLET | Freq: Every day | ORAL | 0 refills | Status: DC

## 2020-04-15 MED ORDER — LISINOPRIL 5 MG PO TABS: 5.0000 mg | ORAL_TABLET | Freq: Every day | ORAL | 0 refills | Status: DC

## 2020-04-15 MED ORDER — HYDROCHLOROTHIAZIDE 25 MG PO TABS
25.0000 mg | ORAL_TABLET | Freq: Every day | ORAL | 0 refills | Status: DC
Start: 2020-04-15 — End: 2020-12-04

## 2020-04-15 NOTE — Progress Notes (Signed)
Established patient visit   Patient: Ashley Massey   DOB: 1975-11-21   45 y.o. Female  MRN: [id number] Visit Date: 04/15/2020  Today's healthcare provider: Marcille Buffy, FNP   No chief complaint on file.  Subjective    HPI  Anxiety, Follow-up  She was last seen for anxiety 7 months ago. Changes made at last visit include none, patient reports that she discontinued Bupropion a month after last visit.   She reports excellent compliance with treatment. She reports good tolerance of treatment. She is not having side effects.   She feels her anxiety is mild and Unchanged since last visit.  Symptoms: No chest pain No difficulty concentrating  No dizziness No fatigue  No feelings of losing control No insomnia  No irritable No palpitations  Yes panic attacks No racing thoughts  No shortness of breath No sweating  No tremors/shakes    GAD-7 Results No flowsheet data found.  PHQ-9 Scores PHQ9 SCORE ONLY 09/26/2019 07/07/2019  PHQ-9 Total Score 5 0    --------------------------------------------------------------------------------------------------- Hypertension, follow-up  BP Readings from Last 3 Encounters:  04/15/20 (!) 152/86  09/26/19 131/76  08/31/19 (!) 170/110   Wt Readings from Last 3 Encounters:  04/15/20 141 lb (64 kg)  09/26/19 144 lb 3.2 oz (65.4 kg)  08/31/19 138 lb 8 oz (62.8 kg)    [place], [place] nurse patient was overdue for visit and lisinopril not refilled.    [facility]  3:22 PM Note ----- Message from [place] Finer sent at 02/27/2020  2:10 PM EST ----- Regarding: needs appointment Please schedule 2 month F/U appointment. Patient did not show for last schedule visit. Thank you!      She was last seen for hypertension 7 months ago.  BP at that visit was 131/76. Management since that visit includes none.  She reports excellent compliance with treatment. She is not having side effects.  She is following a  Regular diet. She is exercising. She does not smoke.  Use of agents associated with hypertension: none.   Outside blood pressures are patient reports >130/80. Symptoms: No chest pain No chest pressure  No palpitations No syncope  No dyspnea No orthopnea  No paroxysmal nocturnal dyspnea No lower extremity edema   Pertinent labs: Lab Results  Component Value Date   CHOL 248 (H) 07/17/2019   HDL 70 07/17/2019   LDLCALC 163 (H) 07/17/2019   TRIG 89 07/17/2019   CHOLHDL 3.5 07/17/2019   Lab Results  Component Value Date   NA 137 04/19/2020   K 3.9 04/19/2020   CREATININE 0.76 04/19/2020   GFRNONAA 81 07/17/2019   GFRAA 93 07/17/2019   GLUCOSE 90 04/19/2020     The 10-year ASCVD risk score Mikey Bussing DC Jr., et al., 2013) is: 1.2%   ---------------------------------------------------------------------------------------------------    Medications: Outpatient Medications Prior to Visit  Medication Sig  . hydrOXYzine (ATARAX/VISTARIL) 50 MG tablet Take 1 tablet (50 mg total) by mouth every 8 (eight) hours as needed for anxiety (May cause drowsiness. No driving with medication or legal decisions).  . medroxyPROGESTERone (DEPO-PROVERA) 150 MG/ML injection Inject 150 mg into the muscle every 3 (three) months.  . rosuvastatin (CRESTOR) 5 MG tablet Take 1 tablet by mouth once daily.  . [DISCONTINUED] hydrochlorothiazide (HYDRODIURIL) 25 MG tablet Take 1 tablet (25 mg total) by mouth daily.  Marland Kitchen buPROPion (WELLBUTRIN SR) 150 MG 12 hr tablet Take 1 tablet (150 mg total) by mouth 2 (two) times  daily. Please schedule office visit before any future refills. (Patient not taking: Reported on 04/15/2020)  . [DISCONTINUED] lisinopril (ZESTRIL) 5 MG tablet Take 1 tablet (5 mg total) by mouth daily.   No facility-administered medications prior to visit.    Review of Systems     Objective    BP (!) 152/86   Pulse 74   Resp 16   Wt 141 lb (64 kg)   SpO2 100%   BMI 24.98 kg/m     Physical  Exam Vitals reviewed.  Constitutional:      General: She is not in acute distress.    Appearance: She is well-developed. She is not diaphoretic.     Interventions: She is not intubated. HENT:     Head: Normocephalic and atraumatic.     Right Ear: External ear normal.     Left Ear: External ear normal.     Nose: Nose normal.     Mouth/Throat:     Pharynx: No oropharyngeal exudate.  Eyes:     General: Lids are normal. No scleral icterus.       Right eye: No discharge.        Left eye: No discharge.     Conjunctiva/sclera: Conjunctivae normal.     Right eye: Right conjunctiva is not injected. No exudate or hemorrhage.    Left eye: Left conjunctiva is not injected. No exudate or hemorrhage.    Pupils: Pupils are equal, round, and reactive to light.  Neck:     Thyroid: No thyroid mass or thyromegaly.     Vascular: Normal carotid pulses. No carotid bruit, hepatojugular reflux or JVD.     Trachea: Trachea and phonation normal. No tracheal tenderness or tracheal deviation.     Meningeal: Brudzinski's sign and Kernig's sign absent.  Cardiovascular:     Rate and Rhythm: Normal rate and regular rhythm.     Pulses: Normal pulses.          Radial pulses are 2+ on the right side and 2+ on the left side.       Dorsalis pedis pulses are 2+ on the right side and 2+ on the left side.       Posterior tibial pulses are 2+ on the right side and 2+ on the left side.     Heart sounds: Normal heart sounds, S1 normal and S2 normal. Heart sounds not distant. No murmur heard. No friction rub. No gallop.   Pulmonary:     Effort: Pulmonary effort is normal. No tachypnea, bradypnea, accessory muscle usage or respiratory distress. She is not intubated.     Breath sounds: Normal breath sounds. No stridor. No wheezing or rales.  Chest:     Chest wall: No tenderness.  Breasts:     Right: No supraclavicular adenopathy.     Left: No supraclavicular adenopathy.    Abdominal:     General: Bowel sounds are  normal. There is no distension or abdominal bruit.     Palpations: Abdomen is soft. There is no shifting dullness, fluid wave, hepatomegaly, splenomegaly, mass or pulsatile mass.     Tenderness: There is no abdominal tenderness. There is no guarding or rebound.     Hernia: No hernia is present.  Musculoskeletal:        General: No tenderness or deformity. Normal range of motion.     Cervical back: Full passive range of motion without pain, normal range of motion and neck supple. No edema, erythema or rigidity. No spinous process tenderness  or muscular tenderness. Normal range of motion.  Lymphadenopathy:     Head:     Right side of head: No submental, submandibular, tonsillar, preauricular, posterior auricular or occipital adenopathy.     Left side of head: No submental, submandibular, tonsillar, preauricular, posterior auricular or occipital adenopathy.     Cervical: No cervical adenopathy.     Right cervical: No superficial, deep or posterior cervical adenopathy.    Left cervical: No superficial, deep or posterior cervical adenopathy.     Upper Body:     Right upper body: No supraclavicular or pectoral adenopathy.     Left upper body: No supraclavicular or pectoral adenopathy.  Skin:    General: Skin is warm and dry.     Coloration: Skin is not pale.     Findings: No abrasion, bruising, burn, ecchymosis, erythema, lesion, petechiae or rash.     Nails: There is no clubbing.  Neurological:     Mental Status: She is alert and oriented to person, place, and time.     GCS: GCS eye subscore is 4. GCS verbal subscore is 5. GCS motor subscore is 6.     Cranial Nerves: No cranial nerve deficit.     Sensory: No sensory deficit.     Motor: No tremor, atrophy, abnormal muscle tone or seizure activity.     Coordination: Coordination normal.     Gait: Gait normal.     Deep Tendon Reflexes: Reflexes are normal and symmetric. Reflexes normal. Babinski sign absent on the right side. Babinski sign  absent on the left side.     Reflex Scores:      Tricep reflexes are 2+ on the right side and 2+ on the left side.      Bicep reflexes are 2+ on the right side and 2+ on the left side.      Brachioradialis reflexes are 2+ on the right side and 2+ on the left side.      Patellar reflexes are 2+ on the right side and 2+ on the left side.      Achilles reflexes are 2+ on the right side and 2+ on the left side. Psychiatric:        Speech: Speech normal.        Behavior: Behavior normal.        Thought Content: Thought content normal.        Judgment: Judgment normal.      Results for orders placed or performed in visit on 04/15/20  CBC With Differential  Result Value Ref Range   WBC 6.5 3.4 - 10.8 x10E3/uL   RBC 4.83 3.77 - 5.28 x10E6/uL   Hemoglobin 14.4 11.1 - 15.9 g/dL   Hematocrit 43.4 34.0 - 46.6 %   MCV 90 79 - 97 fL   MCH 29.8 26.6 - 33.0 pg   MCHC 33.2 31.5 - 35.7 g/dL   RDW 12.9 11.7 - 15.4 %   Neutrophils 69 Not Estab. %   Lymphs 19 Not Estab. %   Monocytes 8 Not Estab. %   Eos 3 Not Estab. %   Basos 1 Not Estab. %   Neutrophils Absolute 4.5 1.4 - 7.0 x10E3/uL   Lymphocytes Absolute 1.2 0.7 - 3.1 x10E3/uL   Monocytes Absolute 0.5 0.1 - 0.9 x10E3/uL   EOS (ABSOLUTE) 0.2 0.0 - 0.4 x10E3/uL   Basophils Absolute 0.0 0.0 - 0.2 x10E3/uL   Immature Granulocytes 0 Not Estab. %   Immature Grans (Abs) 0.0 0.0 - 0.1 x10E3/uL  Comprehensive Metabolic Panel (CMET)  Result Value Ref Range   Glucose 90 65 - 99 mg/dL   BUN 10 6 - 24 mg/dL   Creatinine, Ser 0.76 0.57 - 1.00 mg/dL   eGFR 100 >59 mL/min/1.73   BUN/Creatinine Ratio 13 9 - 23   Sodium 137 134 - 144 mmol/L   Potassium 3.9 3.5 - 5.2 mmol/L   Chloride 98 96 - 106 mmol/L   CO2 18 (L) 20 - 29 mmol/L   Calcium 10.1 8.7 - 10.2 mg/dL   Total Protein 7.0 6.0 - 8.5 g/dL   Albumin 4.8 3.8 - 4.8 g/dL   Globulin, Total 2.2 1.5 - 4.5 g/dL   Albumin/Globulin Ratio 2.2 1.2 - 2.2   Bilirubin Total 0.4 0.0 - 1.2 mg/dL    Alkaline Phosphatase 50 44 - 121 IU/L   AST 17 0 - 40 IU/L   ALT 14 0 - 32 IU/L  TSH  Result Value Ref Range   TSH 2.270 0.450 - 4.500 uIU/mL    Assessment & Plan     Hypertension, unspecified type - Plan: CBC With Differential, Comprehensive Metabolic Panel (CMET), TSH, hydrochlorothiazide (HYDRODIURIL) 25 MG tablet, lisinopril (ZESTRIL) 5 MG tablet  Anxiety - Plan: escitalopram (LEXAPRO) 5 MG tablet  Meds ordered this encounter  Medications  . hydrochlorothiazide (HYDRODIURIL) 25 MG tablet    Sig: Take 1 tablet (25 mg total) by mouth daily.    Dispense:  90 tablet    Refill:  0  . lisinopril (ZESTRIL) 5 MG tablet    Sig: Take 1 tablet (5 mg total) by mouth daily.    Dispense:  90 tablet    Refill:  0  . escitalopram (LEXAPRO) 5 MG tablet    Sig: Take 1 tablet (5 mg total) by mouth daily.    Dispense:  90 tablet    Refill:  0     Return in about 3 months (around 07/15/2020), or if symptoms worsen or fail to improve, for at any time for any worsening symptoms, Go to Emergency room/ urgent care if worse.      The entirety of the information documented in the History of Present Illness, Review of Systems and Physical Exam were personally obtained by me. Portions of this information were initially documented by the CMA and reviewed by me for thoroughness and accuracy.    Marcille Buffy, [place] [phone] (phone) [phone] (fax)  [place]

## 2020-04-15 NOTE — Patient Instructions (Signed)
[email or web address]">  Generalized Anxiety Disorder, Adult Generalized anxiety disorder (GAD) is a mental health condition. Unlike normal worries, anxiety related to GAD is not triggered by a specific event. These worries do not fade or get better with time. GAD interferes with relationships, work, and school. GAD symptoms can vary from mild to severe. People with severe GAD can have intense waves of anxiety with physical symptoms that are similar to panic attacks. What are the causes? The exact cause of GAD is not known, but the following are believed to have an impact:  Differences in natural brain chemicals.  Genes passed down from parents to children.  Differences in the way threats are perceived.  Development during childhood.  Personality. What increases the risk? The following factors may make you more likely to develop this condition:  Being female.  Having a family history of anxiety disorders.  Being very shy.  Experiencing very stressful life events, such as the death of a loved one.  Having a very stressful family environment. What are the signs or symptoms? People with GAD often worry excessively about many things in their lives, such as their health and family. Symptoms may also include:  Mental and emotional symptoms: ? Worrying excessively about natural disasters. ? Fear of being late. ? Difficulty concentrating. ? Fears that others are judging your performance.  Physical symptoms: ? Fatigue. ? Headaches, muscle tension, muscle twitches, trembling, or feeling shaky. ? Feeling like your heart is pounding or beating very fast. ? Feeling out of breath or like you cannot take a deep breath. ? Having trouble falling asleep or staying asleep, or experiencing restlessness. ? Sweating. ? Nausea, diarrhea, or irritable bowel syndrome (IBS).  Behavioral symptoms: ? Experiencing erratic moods or irritability. ? Avoidance of new situations. ? Avoidance of  people. ? Extreme difficulty making decisions. How is this diagnosed? This condition is diagnosed based on your symptoms and medical history. You will also have a physical exam. Your health care provider may perform tests to rule out other possible causes of your symptoms. To be diagnosed with GAD, a person must have anxiety that:  Is out of his or her control.  Affects several different aspects of his or her life, such as work and relationships.  Causes distress that makes him or her unable to take part in normal activities.  Includes at least three symptoms of GAD, such as restlessness, fatigue, trouble concentrating, irritability, muscle tension, or sleep problems. Before your health care provider can confirm a diagnosis of GAD, these symptoms must be present more days than they are not, and they must last for 6 months or longer. How is this treated? This condition may be treated with:  Medicine. Antidepressant medicine is usually prescribed for long-term daily control. Anti-anxiety medicines may be added in severe cases, especially when panic attacks occur.  Talk therapy (psychotherapy). Certain types of talk therapy can be helpful in treating GAD by providing support, education, and guidance. Options include: ? Cognitive behavioral therapy (CBT). People learn coping skills and self-calming techniques to ease their physical symptoms. They learn to identify unrealistic thoughts and behaviors and to replace them with more appropriate thoughts and behaviors. ? Acceptance and commitment therapy (ACT). This treatment teaches people how to be mindful as a way to cope with unwanted thoughts and feelings. ? Biofeedback. This process trains you to manage your body's response (physiological response) through breathing techniques and relaxation methods. You will work with a therapist while machines are used to monitor your physical   symptoms.  Stress management techniques. These include yoga,  meditation, and exercise. A mental health specialist can help determine which treatment is best for you. Some people see improvement with one type of therapy. However, other people require a combination of therapies.   Follow these instructions at home: Lifestyle  Maintain a consistent routine and schedule.  Anticipate stressful situations. Create a plan, and allow extra time to work with your plan.  Practice stress management or self-calming techniques that you have learned from your therapist or your health care provider. General instructions  Take over-the-counter and prescription medicines only as told by your health care provider.  Understand that you are likely to have setbacks. Accept this and be kind to yourself as you persist to take better care of yourself.  Recognize and accept your accomplishments, even if you judge them as small.  Keep all follow-up visits as told by your health care provider. This is important. Contact a health care provider if:  Your symptoms do not get better.  Your symptoms get worse.  You have signs of depression, such as: ? A persistently sad or irritable mood. ? Loss of enjoyment in activities that used to bring you joy. ? Change in weight or eating. ? Changes in sleeping habits. ? Avoiding friends or family members. ? Loss of energy for normal tasks. ? Feelings of guilt or worthlessness. Get help right away if:  You have serious thoughts about hurting yourself or others. If you ever feel like you may hurt yourself or others, or have thoughts about taking your own life, get help right away. Go to your nearest emergency department or:  Call your local emergency services (911 in the [place]  Call a suicide crisis helpline, such as the [place] at 332-613-5968. This is open 24 hours a day in the [place]  Text the Crisis Text Line at 450-231-1093 (in the [place].). Summary  Generalized anxiety disorder (GAD) is a mental  health condition that involves worry that is not triggered by a specific event.  People with GAD often worry excessively about many things in their lives, such as their health and family.  GAD may cause symptoms such as restlessness, trouble concentrating, sleep problems, frequent sweating, nausea, diarrhea, headaches, and trembling or muscle twitching.  A mental health specialist can help determine which treatment is best for you. Some people see improvement with one type of therapy. However, other people require a combination of therapies. This information is not intended to replace advice given to you by your health care provider. Make sure you discuss any questions you have with your health care provider. Document Revised: 10/12/2018 Document Reviewed: 10/12/2018 Elsevier Patient Education  2021 [place]. Escitalopram Tablets What is this medicine? ESCITALOPRAM (es sye TAL oh pram) is used to treat depression and certain types of anxiety. This medicine may be used for other purposes; ask your health care provider or pharmacist if you have questions. COMMON BRAND NAME(S): Lexapro What should I tell my health care provider before I take this medicine? They need to know if you have any of these conditions:  bipolar disorder or a family history of bipolar disorder  diabetes  glaucoma  heart disease  kidney or liver disease  receiving electroconvulsive therapy  seizures (convulsions)  suicidal thoughts, plans, or attempt by you or a family member  an unusual or allergic reaction to escitalopram, the related drug citalopram, other medicines, foods, dyes, or preservatives  pregnant or trying to become pregnant  breast-feeding How should I use this medicine? Take this medicine by mouth with a glass of water. Follow the directions on the prescription label. You can take it with or without food. If it upsets your stomach, take it with food. Take your medicine at regular  intervals. Do not take it more often than directed. Do not stop taking this medicine suddenly except upon the advice of your doctor. Stopping this medicine too quickly may cause serious side effects or your condition may worsen. A special MedGuide will be given to you by the pharmacist with each prescription and refill. Be sure to read this information carefully each time. Talk to your pediatrician regarding the use of this medicine in children. Special care may be needed. Overdosage: If you think you have taken too much of this medicine contact a poison control center or emergency room at once. NOTE: This medicine is only for you. Do not share this medicine with others. What if I miss a dose? If you miss a dose, take it as soon as you can. If it is almost time for your next dose, take only that dose. Do not take double or extra doses. What may interact with this medicine? Do not take this medicine with any of the following medications:  certain medicines for fungal infections like fluconazole, itraconazole, ketoconazole, posaconazole, voriconazole  cisapride  citalopram  dronedarone  linezolid  MAOIs like Carbex, Eldepryl, Marplan, Nardil, and Parnate  methylene blue (injected into a vein)  pimozide  thioridazine This medicine may also interact with the following medications:  alcohol  amphetamines  aspirin and aspirin-like medicines  carbamazepine  certain medicines for depression, anxiety, or psychotic disturbances  certain medicines for migraine headache like almotriptan, eletriptan, frovatriptan, naratriptan, rizatriptan, sumatriptan, zolmitriptan  certain medicines for sleep  certain medicines that treat or prevent blood clots like warfarin, enoxaparin, dalteparin  cimetidine  diuretics  dofetilide  fentanyl  furazolidone  isoniazid  lithium  metoprolol  NSAIDs, medicines for pain and inflammation, like ibuprofen or naproxen  other medicines that  prolong the QT interval (cause an abnormal heart rhythm)  procarbazine  rasagiline  supplements like St. John's wort, kava kava, valerian  tramadol  tryptophan  ziprasidone This list may not describe all possible interactions. Give your health care provider a list of all the medicines, herbs, non-prescription drugs, or dietary supplements you use. Also tell them if you smoke, drink alcohol, or use illegal drugs. Some items may interact with your medicine. What should I watch for while using this medicine? Tell your doctor if your symptoms do not get better or if they get worse. Visit your doctor or health care professional for regular checks on your progress. Because it may take several weeks to see the full effects of this medicine, it is important to continue your treatment as prescribed by your doctor. Patients and their families should watch out for new or worsening thoughts of suicide or depression. Also watch out for sudden changes in feelings such as feeling anxious, agitated, panicky, irritable, hostile, aggressive, impulsive, severely restless, overly excited and hyperactive, or not being able to sleep. If this happens, especially at the beginning of treatment or after a change in dose, call your health care professional. Dennis Bast may get drowsy or dizzy. Do not drive, use machinery, or do anything that needs mental alertness until you know how this medicine affects you. Do not stand or sit up quickly, especially if you are an older patient. This reduces the risk of dizzy  or fainting spells. Alcohol may interfere with the effect of this medicine. Avoid alcoholic drinks. Your mouth may get dry. Chewing sugarless gum or sucking hard candy, and drinking plenty of water may help. Contact your doctor if the problem does not go away or is severe. What side effects may I notice from receiving this medicine? Side effects that you should report to your doctor or health care professional as soon as  possible:  allergic reactions like skin rash, itching or hives, swelling of the face, lips, or tongue  anxious  black, tarry stools  changes in vision  confusion  elevated mood, decreased need for sleep, racing thoughts, impulsive behavior  eye pain  fast, irregular heartbeat  feeling faint or lightheaded, falls  feeling agitated, angry, or irritable  hallucination, loss of contact with reality  loss of balance or coordination  loss of memory  painful or prolonged erections  restlessness, pacing, inability to keep still  seizures  stiff muscles  suicidal thoughts or other mood changes  trouble sleeping  unusual bleeding or bruising  unusually weak or tired  vomiting Side effects that usually do not require medical attention (report to your doctor or health care professional if they continue or are bothersome):  changes in appetite  change in sex drive or performance  headache  increased sweating  indigestion, nausea  tremors This list may not describe all possible side effects. Call your doctor for medical advice about side effects. You may report side effects to FDA at 1-800-FDA-1088. Where should I keep my medicine? Keep out of reach of children. Store at room temperature between 15 and 30 degrees C (59 and 86 degrees F). Throw away any unused medicine after the expiration date. NOTE: This sheet is a summary. It may not cover all possible information. If you have questions about this medicine, talk to your doctor, pharmacist, or health care provider.  2021 Elsevier/Gold Standard (2019-11-13 09:53:34) Hypertension, Adult Hypertension is another name for high blood pressure. High blood pressure forces your heart to work harder to pump blood. This can cause problems over time. There are two numbers in a blood pressure reading. There is a top number (systolic) over a bottom number (diastolic). It is best to have a blood pressure that is below 120/80.  Healthy choices can help lower your blood pressure, or you may need medicine to help lower it. What are the causes? The cause of this condition is not known. Some conditions may be related to high blood pressure. What increases the risk?  Smoking.  Having type 2 diabetes mellitus, high cholesterol, or both.  Not getting enough exercise or physical activity.  Being overweight.  Having too much fat, sugar, calories, or salt (sodium) in your diet.  Drinking too much alcohol.  Having long-term (chronic) kidney disease.  Having a family history of high blood pressure.  Age. Risk increases with age.  Race. You may be at higher risk if you are African American.  Gender. Men are at higher risk than women before age 32. After age 14, women are at higher risk than men.  Having obstructive sleep apnea.  Stress. What are the signs or symptoms?  High blood pressure may not cause symptoms. Very high blood pressure (hypertensive crisis) may cause: ? Headache. ? Feelings of worry or nervousness (anxiety). ? Shortness of breath. ? Nosebleed. ? A feeling of being sick to your stomach (nausea). ? Throwing up (vomiting). ? Changes in how you see. ? Very bad chest pain. ?  Seizures. How is this treated?  This condition is treated by making healthy lifestyle changes, such as: ? Eating healthy foods. ? Exercising more. ? Drinking less alcohol.  Your health care provider may prescribe medicine if lifestyle changes are not enough to get your blood pressure under control, and if: ? Your top number is above 130. ? Your bottom number is above 80.  Your personal target blood pressure may vary. Follow these instructions at home: Eating and drinking  If told, follow the DASH eating plan. To follow this plan: ? Fill one half of your plate at each meal with fruits and vegetables. ? Fill one fourth of your plate at each meal with whole grains. Whole grains include whole-wheat pasta, brown  rice, and whole-grain bread. ? Eat or drink low-fat dairy products, such as skim milk or low-fat yogurt. ? Fill one fourth of your plate at each meal with low-fat (lean) proteins. Low-fat proteins include fish, chicken without skin, eggs, beans, and tofu. ? Avoid fatty meat, cured and processed meat, or chicken with skin. ? Avoid pre-made or processed food.  Eat less than 1,500 mg of salt each day.  Do not drink alcohol if: ? Your doctor tells you not to drink. ? You are pregnant, may be pregnant, or are planning to become pregnant.  If you drink alcohol: ? Limit how much you use to:  0-1 drink a day for women.  0-2 drinks a day for men. ? Be aware of how much alcohol is in your drink. In the [place] one drink equals one 12 oz bottle of beer (355 mL), one 5 oz glass of wine (148 mL), or one 1 oz glass of hard liquor (44 mL).   Lifestyle  Work with your doctor to stay at a healthy weight or to lose weight. Ask your doctor what the best weight is for you.  Get at least 30 minutes of exercise most days of the week. This may include walking, swimming, or biking.  Get at least 30 minutes of exercise that strengthens your muscles (resistance exercise) at least 3 days a week. This may include lifting weights or doing Pilates.  Do not use any products that contain nicotine or tobacco, such as cigarettes, e-cigarettes, and chewing tobacco. If you need help quitting, ask your doctor.  Check your blood pressure at home as told by your doctor.  Keep all follow-up visits as told by your doctor. This is important.   Medicines  Take over-the-counter and prescription medicines only as told by your doctor. Follow directions carefully.  Do not skip doses of blood pressure medicine. The medicine does not work as well if you skip doses. Skipping doses also puts you at risk for problems.  Ask your doctor about side effects or reactions to medicines that you should watch for. Contact a doctor if  you:  Think you are having a reaction to the medicine you are taking.  Have headaches that keep coming back (recurring).  Feel dizzy.  Have swelling in your ankles.  Have trouble with your vision. Get help right away if you:  Get a very bad headache.  Start to feel mixed up (confused).  Feel weak or numb.  Feel faint.  Have very bad pain in your: ? Chest. ? Belly (abdomen).  Throw up more than once.  Have trouble breathing. Summary  Hypertension is another name for high blood pressure.  High blood pressure forces your heart to work harder to pump blood.  For  most people, a normal blood pressure is less than 120/80.  Making healthy choices can help lower blood pressure. If your blood pressure does not get lower with healthy choices, you may need to take medicine. This information is not intended to replace advice given to you by your health care provider. Make sure you discuss any questions you have with your health care provider. Document Revised: 09/01/2017 Document Reviewed: 09/01/2017 Elsevier Patient Education  2021 [employer].

## 2020-04-16 NOTE — Telephone Encounter (Signed)
Multiple attempts to reach.  Closing encounter.

## 2020-04-19 DIAGNOSIS — I10 Essential (primary) hypertension: Secondary | ICD-10-CM | POA: Diagnosis not present

## 2020-04-20 LAB — TSH: TSH: 2.27 u[IU]/mL (ref 0.450–4.500)

## 2020-04-20 LAB — CBC WITH DIFFERENTIAL
Basophils Absolute: 0 10*3/uL (ref 0.0–0.2)
Basos: 1 %
EOS (ABSOLUTE): 0.2 10*3/uL (ref 0.0–0.4)
Eos: 3 %
Hematocrit: 43.4 % (ref 34.0–46.6)
Hemoglobin: 14.4 g/dL (ref 11.1–15.9)
Immature Grans (Abs): 0 10*3/uL (ref 0.0–0.1)
Immature Granulocytes: 0 %
Lymphocytes Absolute: 1.2 10*3/uL (ref 0.7–3.1)
Lymphs: 19 %
MCH: 29.8 pg (ref 26.6–33.0)
MCHC: 33.2 g/dL (ref 31.5–35.7)
MCV: 90 fL (ref 79–97)
Monocytes Absolute: 0.5 10*3/uL (ref 0.1–0.9)
Monocytes: 8 %
Neutrophils Absolute: 4.5 10*3/uL (ref 1.4–7.0)
Neutrophils: 69 %
RBC: 4.83 x10E6/uL (ref 3.77–5.28)
RDW: 12.9 % (ref 11.7–15.4)
WBC: 6.5 10*3/uL (ref 3.4–10.8)

## 2020-04-20 LAB — COMPREHENSIVE METABOLIC PANEL
ALT: 14 IU/L (ref 0–32)
AST: 17 IU/L (ref 0–40)
Albumin/Globulin Ratio: 2.2 (ref 1.2–2.2)
Albumin: 4.8 g/dL (ref 3.8–4.8)
Alkaline Phosphatase: 50 IU/L (ref 44–121)
BUN/Creatinine Ratio: 13 (ref 9–23)
BUN: 10 mg/dL (ref 6–24)
Bilirubin Total: 0.4 mg/dL (ref 0.0–1.2)
CO2: 18 mmol/L — ABNORMAL LOW (ref 20–29)
Calcium: 10.1 mg/dL (ref 8.7–10.2)
Chloride: 98 mmol/L (ref 96–106)
Creatinine, Ser: 0.76 mg/dL (ref 0.57–1.00)
Globulin, Total: 2.2 g/dL (ref 1.5–4.5)
Glucose: 90 mg/dL (ref 65–99)
Potassium: 3.9 mmol/L (ref 3.5–5.2)
Sodium: 137 mmol/L (ref 134–144)
Total Protein: 7 g/dL (ref 6.0–8.5)
eGFR: 100 mL/min/{1.73_m2} (ref >59)

## 2020-04-22 NOTE — Progress Notes (Signed)
CBC, CMP and TSH within normal limits.

## 2020-04-30 DIAGNOSIS — Z3042 Encounter for surveillance of injectable contraceptive: Secondary | ICD-10-CM | POA: Diagnosis not present

## 2020-05-05 DIAGNOSIS — Z419 Encounter for procedure for purposes other than remedying health state, unspecified: Secondary | ICD-10-CM | POA: Diagnosis not present

## 2020-05-15 ENCOUNTER — Ambulatory Visit (INDEPENDENT_AMBULATORY_CARE_PROVIDER_SITE_OTHER): Payer: Medicaid Other | Admitting: Advanced Practice Midwife

## 2020-05-15 ENCOUNTER — Other Ambulatory Visit: Payer: Self-pay

## 2020-05-15 ENCOUNTER — Encounter: Payer: Self-pay | Admitting: Advanced Practice Midwife

## 2020-05-15 ENCOUNTER — Other Ambulatory Visit (HOSPITAL_COMMUNITY)
Admission: RE | Admit: 2020-05-15 | Discharge: 2020-05-15 | Disposition: A | Discharge status: Home / Self Care | Payer: Medicaid Other | Source: Ambulatory Visit | Attending: Advanced Practice Midwife | Admitting: Advanced Practice Midwife

## 2020-05-15 VITALS — BP 148/82 | HR 69 | Ht 62.0 in | Wt 136.2 lb

## 2020-05-15 DIAGNOSIS — D219 Benign neoplasm of connective and other soft tissue, unspecified: Secondary | ICD-10-CM | POA: Insufficient documentation

## 2020-05-15 DIAGNOSIS — Z01419 Encounter for gynecological examination (general) (routine) without abnormal findings: Secondary | ICD-10-CM | POA: Diagnosis not present

## 2020-05-15 DIAGNOSIS — Z9889 Other specified postprocedural states: Secondary | ICD-10-CM | POA: Insufficient documentation

## 2020-05-15 NOTE — Patient Instructions (Signed)
Preventive Care 84-43 Years Old, Female Preventive care refers to lifestyle choices and visits with your health care provider that can promote health and wellness. This includes:  A yearly physical exam. This is also called an annual wellness visit.  Regular dental and eye exams.  Immunizations.  Screening for certain conditions.  Healthy lifestyle choices, such as: ? Eating a healthy diet. ? Getting regular exercise. ? Not using drugs or products that contain nicotine and tobacco. ? Limiting alcohol use. What can I expect for my preventive care visit? Physical exam Your health care provider will check your:  Height and weight. These may be used to calculate your BMI (body mass index). BMI is a measurement that tells if you are at a healthy weight.  Heart rate and blood pressure.  Body temperature.  Skin for abnormal spots. Counseling Your health care provider may ask you questions about your:  Past medical problems.  Family's medical history.  Alcohol, tobacco, and drug use.  Emotional well-being.  Home life and relationship well-being.  Sexual activity.  Diet, exercise, and sleep habits.  Work and work Statistician.  Access to firearms.  Method of birth control.  Menstrual cycle.  Pregnancy history. What immunizations do I need? Vaccines are usually given at various ages, according to a schedule. Your health care provider will recommend vaccines for you based on your age, medical history, and lifestyle or other factors, such as travel or where you work.   What tests do I need? Blood tests  Lipid and cholesterol levels. These may be checked every 5 years, or more often if you are over 43 years old.  Hepatitis C test.  Hepatitis B test. Screening  Lung cancer screening. You may have this screening every year starting at age 43 if you have a 30-pack-year history of smoking and currently smoke or have quit within the past 15 years.  Colorectal cancer  screening. ? All adults should have this screening starting at age 43 and continuing until age 17. ? Your health care provider may recommend screening at age 43 if you are at increased risk. ? You will have tests every 1-10 years, depending on your results and the type of screening test.  Diabetes screening. ? This is done by checking your blood sugar (glucose) after you have not eaten for a while (fasting). ? You may have this done every 1-3 years.  Mammogram. ? This may be done every 43-2 years. ? Talk with your health care provider about when you should start having regular mammograms. This may depend on whether you have a family history of breast cancer.  BRCA-related cancer screening. This may be done if you have a family history of breast, ovarian, tubal, or peritoneal cancers.  Pelvic exam and Pap test. ? This may be done every 43 years starting at age 10. ? Starting at age 43, this may be done every 5 years if you have a Pap test in combination with an HPV test. Other tests  STD (sexually transmitted disease) testing, if you are at risk.  Bone density scan. This is done to screen for osteoporosis. You may have this scan if you are at high risk for osteoporosis. Talk with your health care provider about your test results, treatment options, and if necessary, the need for more tests. Follow these instructions at home: Eating and drinking  Eat a diet that includes fresh fruits and vegetables, whole grains, lean protein, and low-fat dairy products.  Take vitamin and mineral supplements  as recommended by your health care provider.  Do not drink alcohol if: ? Your health care provider tells you not to drink. ? You are pregnant, may be pregnant, or are planning to become pregnant.  If you drink alcohol: ? Limit how much you have to 0-1 drink a day. ? Be aware of how much alcohol is in your drink. In the [place] one drink equals one 12 oz bottle of beer (355 mL), one 5 oz glass of  wine (148 mL), or one 1 oz glass of hard liquor (44 mL).   Lifestyle  Take daily care of your teeth and gums. Brush your teeth every morning and night with fluoride toothpaste. Floss one time each day.  Stay active. Exercise for at least 30 minutes 5 or more days each week.  Do not use any products that contain nicotine or tobacco, such as cigarettes, e-cigarettes, and chewing tobacco. If you need help quitting, ask your health care provider.  Do not use drugs.  If you are sexually active, practice safe sex. Use a condom or other form of protection to prevent STIs (sexually transmitted infections).  If you do not wish to become pregnant, use a form of birth control. If you plan to become pregnant, see your health care provider for a prepregnancy visit.  If told by your health care provider, take low-dose aspirin daily starting at age 43.  Find healthy ways to cope with stress, such as: ? Meditation, yoga, or listening to music. ? Journaling. ? Talking to a trusted person. ? Spending time with friends and family. Safety  Always wear your seat belt while driving or riding in a vehicle.  Do not drive: ? If you have been drinking alcohol. Do not ride with someone who has been drinking. ? When you are tired or distracted. ? While texting.  Wear a helmet and other protective equipment during sports activities.  If you have firearms in your house, make sure you follow all gun safety procedures. What's next?  Visit your health care provider once a year for an annual wellness visit.  Ask your health care provider how often you should have your eyes and teeth checked.  Stay up to date on all vaccines. This information is not intended to replace advice given to you by your health care provider. Make sure you discuss any questions you have with your health care provider. Document Revised: 09/26/2019 Document Reviewed: 09/02/2017 Elsevier Patient Education  2021 Elsevier Inc.  

## 2020-05-15 NOTE — Progress Notes (Signed)
GYNECOLOGY ANNUAL PREVENTATIVE CARE ENCOUNTER NOTE  History:     Ashley Massey is a 45 y.o. G26P1011 female here for a routine annual gynecologic exam.  Current complaints: none.   Denies abnormal vaginal bleeding, discharge, pelvic pain, problems with intercourse or other gynecologic concerns.    Patient initiated Depo predominantly for management of heavy menstrual periods. Originally initiated in 2018 but discontinued use for a brief period. Has been on Depo continuously for about two years.   Gynecologic History No LMP recorded. Patient has had an injection. Contraception: Depo-Provera injections Last Pap: . Results were: normal with negative HPV Last mammogram: No mammogram history Obstetric History OB History  Gravida Para Term Preterm AB Living  2 1 1   1 1   SAB IAB Ectopic Multiple Live Births    1     1    # Outcome Date GA Lbr Len/2nd Weight Sex Delivery Anes PTL Lv  2 IAB 2020          1 Term 01/25/12    F Vag-Spont EPI  LIV    Past Medical History:  Diagnosis Date  . Hypertension     Past Surgical History:  Procedure Laterality Date  . UTERINE FIBROID SURGERY      Current Outpatient Medications on File Prior to Visit  Medication Sig Dispense Refill  . hydrochlorothiazide (HYDRODIURIL) 25 MG tablet Take 1 tablet (25 mg total) by mouth daily. 90 tablet 0  . hydrOXYzine (ATARAX/VISTARIL) 50 MG tablet Take 1 tablet (50 mg total) by mouth every 8 (eight) hours as needed for anxiety (May cause drowsiness. No driving with medication or legal decisions). 60 tablet 1  . lisinopril (ZESTRIL) 5 MG tablet Take 1 tablet (5 mg total) by mouth daily. 90 tablet 0  . medroxyPROGESTERone (DEPO-PROVERA) 150 MG/ML injection Inject 150 mg into the muscle every 3 (three) months.    . rosuvastatin (CRESTOR) 5 MG tablet Take 1 tablet by mouth once daily. 90 tablet 0  . buPROPion (WELLBUTRIN SR) 150 MG 12 hr tablet Take 1 tablet (150 mg total) by mouth 2 (two) times daily. Please  schedule office visit before any future refills. (Patient not taking: Reported on 04/15/2020) 60 tablet 0  . escitalopram (LEXAPRO) 5 MG tablet Take 1 tablet (5 mg total) by mouth daily. (Patient not taking: Reported on 05/15/2020) 90 tablet 0   No current facility-administered medications on file prior to visit.    No Known Allergies  Social History:  reports that she has never smoked. She has never used smokeless tobacco. She reports current alcohol use of about 12.0 standard drinks of alcohol per week. She reports that she does not use drugs.  Family History  Problem Relation Age of Onset  . Hypertension Mother   . Hypertension Father   . Healthy Sister   . Hypertension Maternal Grandmother   . Hypertension Maternal Grandfather   . Heart disease Paternal Grandmother   . Hypertension Paternal Grandmother   . Hypertension Paternal Grandfather     The following portions of the patient's history were reviewed and updated as appropriate: allergies, current medications, past family history, past medical history, past social history, past surgical history and problem list.  Review of Systems Pertinent items noted in HPI and remainder of comprehensive ROS otherwise negative.  Physical Exam:  BP (!) 148/82   Pulse 69   Ht 5\' 2"  (1.575 m)   Wt 136 lb 3.2 oz (61.8 kg)   BMI 24.91 kg/m  CONSTITUTIONAL: Well-developed, well-nourished female in no acute distress.  HENT:  Normocephalic, atraumatic, External right and left ear normal.  EYES: Conjunctivae and EOM are normal. Pupils are equal, round, and reactive to light. No scleral icterus.  SKIN: Skin is warm and dry. No rash noted. Not diaphoretic. No erythema. No pallor. MUSCULOSKELETAL: Normal range of motion. No tenderness.  No cyanosis, clubbing, or edema. NEUROLOGIC: Alert and oriented to person, place, and time. Normal reflexes, muscle tone coordination.  PSYCHIATRIC: Normal mood and affect. Normal behavior. Normal judgment and  thought content. CARDIOVASCULAR: Normal heart rate noted, regular rhythm RESPIRATORY: Clear to auscultation bilaterally. Effort and breath sounds normal, no problems with respiration noted. BREASTS: Symmetric in size. No masses, tenderness, skin changes, nipple drainage, or lymphadenopathy bilaterally. Performed in the presence of a chaperone. ABDOMEN: Soft, no distention noted.  No tenderness, rebound or guarding.  PELVIC: Normal appearing external genitalia and urethral meatus; normal appearing vaginal mucosa and cervix.  No abnormal discharge noted.  Pap smear obtained.  Normal uterine size, no other palpable masses, no uterine or adnexal tenderness.  Performed in the presence of a chaperone.   Assessment and Plan:    1. Well woman exam with routine gynecological exam - No acute concerns, has PCP - [facility] management [facility]. Easier logistics for patient - Cytology - PAP - MM 3D SCREEN BREAST BILATERAL; Future  Will follow up results of pap smear and manage accordingly. Mammogram scheduled Routine preventative health maintenance measures emphasized. Please refer to After Visit Summary for other counseling recommendations.     Mallie Snooks, MSN, CNM Certified Nurse Midwife, [employer] for [employer], [place] 05/15/20 1:37 PM

## 2020-05-17 LAB — CYTOLOGY - PAP
Comment: NEGATIVE
Diagnosis: NEGATIVE
High risk HPV: NEGATIVE

## 2020-06-05 DIAGNOSIS — Z419 Encounter for procedure for purposes other than remedying health state, unspecified: Secondary | ICD-10-CM | POA: Diagnosis not present

## 2020-06-08 ENCOUNTER — Other Ambulatory Visit: Payer: Self-pay | Admitting: Adult Health

## 2020-06-08 DIAGNOSIS — F419 Anxiety disorder, unspecified: Secondary | ICD-10-CM

## 2020-06-10 ENCOUNTER — Encounter: Payer: Self-pay | Admitting: Physician Assistant

## 2020-06-10 ENCOUNTER — Telehealth: Payer: Medicaid Other | Admitting: Physician Assistant

## 2020-06-10 ENCOUNTER — Other Ambulatory Visit: Payer: Self-pay

## 2020-06-10 ENCOUNTER — Telehealth: Payer: Self-pay

## 2020-06-10 DIAGNOSIS — H1033 Unspecified acute conjunctivitis, bilateral: Secondary | ICD-10-CM

## 2020-06-10 MED ORDER — OFLOXACIN 0.3 % OP SOLN: 1.0000 [drp] | Freq: Four times a day (QID) | OPHTHALMIC | 0 refills | Status: DC

## 2020-06-10 NOTE — Patient Instructions (Signed)

## 2020-06-10 NOTE — Telephone Encounter (Signed)
Called pt to relay provider message. Pt was already notified Sharyn Lull wasn't in and has been seen virtually through [place].

## 2020-06-10 NOTE — Progress Notes (Signed)
[name] are scheduled for a virtual visit with your provider today.    Just as we do with appointments in the office, we must obtain your consent to participate.  Your consent will be active for this visit and any virtual visit you may have with one of our providers in the next 365 days.    If you have a MyChart account, I can also send a copy of this consent to you electronically.  All virtual visits are billed to your insurance company just like a traditional visit in the office.  As this is a virtual visit, video technology does not allow for your provider to perform a traditional examination.  This may limit your provider's ability to fully assess your condition.  If your provider identifies any concerns that need to be evaluated in person or the need to arrange testing such as labs, EKG, etc, we will make arrangements to do so.    Although advances in technology are sophisticated, we cannot ensure that it will always work on either your end or our end.  If the connection with a video visit is poor, we may have to switch to a telephone visit.  With either a video or telephone visit, we are not always able to ensure that we have a secure connection.   I need to obtain your verbal consent now.   Are you willing to proceed with your visit today?   Ashley Massey has provided verbal consent on 06/10/2020 for a virtual visit (video or telephone).   Mar Daring, PA-C 06/10/2020  8:46 AM    MyChart Video Visit    Virtual Visit via Video Note   This visit type was conducted due to national recommendations for restrictions regarding the COVID-19 Pandemic (e.g. social distancing) in an effort to limit this patient's exposure and mitigate transmission in our community. This patient is at least at moderate risk for complications without adequate follow up. This format is felt to be most appropriate for this patient at this time. Physical exam was limited by quality of the video and audio  technology used for the visit.   Patient location: Home Provider location: Home office in [place]  I discussed the limitations of evaluation and management by telemedicine and the availability of in person appointments. The patient expressed understanding and agreed to proceed.  Patient: Ashley Massey   DOB: October 24, 1975   45 y.o. Female  MRN: [id number] Visit Date: 06/10/2020  Today's healthcare provider: Mar Daring, PA-C   No chief complaint on file.  Subjective    Conjunctivitis  The current episode started yesterday. The onset was sudden. The problem has been unchanged. The problem is mild. Nothing relieves the symptoms. Nothing aggravates the symptoms. Associated symptoms include decreased vision (slightly blurry]), eye itching (both), eye discharge (green, purulent discharge this morning in right eye) and eye redness (right eye). Pertinent negatives include no double vision, no photophobia and no eye pain. The eye pain is mild. Both eyes are affected.The eye pain is not associated with movement. The eyelid exhibits swelling and redness.    Symptoms started in the right eye yesterday. This morning, she awoke with right eye crusted shut and a green, purulent discharge from the eye. She mentions feeling the "sand paper grit" to the left eye this morning as well. No redness in the left eye yet.   Patient Active Problem List   Diagnosis Date Noted  . Fibroid 05/15/2020  . H/O LEEP 05/15/2020  .  No-show for appointment- 12/26/2019 01/02/2020  . Hyperlipidemia 09/26/2019  . Hypertension 09/26/2019  . Anxiety 09/26/2019  . History of chest pain 07/11/2019  . Colicky RUQ abdominal pain 07/11/2019  . 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 Active labor 01/25/2012  . GBS (group B Streptococcus carrier), +RV culture, currently pregnant 01/25/2012  . Hypertension       Medications: Outpatient Medications Prior to Visit  Medication Sig   . buPROPion (WELLBUTRIN SR) 150 MG 12 hr tablet Take 1 tablet (150 mg total) by mouth 2 (two) times daily. Please schedule office visit before any future refills. (Patient not taking: Reported on 04/15/2020)  . escitalopram (LEXAPRO) 5 MG tablet Take 1 tablet (5 mg total) by mouth daily. (Patient not taking: Reported on 05/15/2020)  . hydrochlorothiazide (HYDRODIURIL) 25 MG tablet Take 1 tablet (25 mg total) by mouth daily.  . hydrOXYzine (ATARAX/VISTARIL) 50 MG tablet Take 1 tablet (50 mg total) by mouth every 8 (eight) hours as needed for anxiety (May cause drowsiness. No driving with medication or legal decisions).  Mar[place] Kitchen lisinopril (ZESTRIL) 5 MG tablet Take 1 tablet (5 mg total) by mouth daily.  . medroxyPROGESTERone (DEPO-PROVERA) 150 MG/ML injection Inject 150 mg into the muscle every 3 (three) months.  . rosuvastatin (CRESTOR) 5 MG tablet Take 1 tablet by mouth once daily.   No facility-administered medications prior to visit.    Review of Systems  Constitutional: Negative.   HENT: Negative.   Eyes: Positive for discharge (green, purulent discharge this morning in right eye), redness (right eye), itching (both) and visual disturbance (slight blurring of vision). Negative for double vision, photophobia and pain.  Respiratory: Negative.   Cardiovascular: Negative.   Neurological: Negative.     Last CBC Lab Results  Component Value Date   WBC 6.5 04/19/2020   HGB 14.4 04/19/2020   HCT 43.4 04/19/2020   MCV 90 04/19/2020   MCH 29.8 04/19/2020   RDW 12.9 04/19/2020   PLT 290 74/25/9563   Last metabolic panel Lab Results  Component Value Date   GLUCOSE 90 04/19/2020   NA 137 04/19/2020   K 3.9 04/19/2020   CL 98 04/19/2020   CO2 18 (L) 04/19/2020   BUN 10 04/19/2020   CREATININE 0.76 04/19/2020   GFRNONAA 81 07/17/2019   GFRAA 93 07/17/2019   CALCIUM 10.1 04/19/2020   PROT 7.0 04/19/2020   ALBUMIN 4.8 04/19/2020   LABGLOB 2.2 04/19/2020   AGRATIO 2.2 04/19/2020   BILITOT  0.4 04/19/2020   ALKPHOS 50 04/19/2020   AST 17 04/19/2020   ALT 14 04/19/2020   ANIONGAP 16 (H) 07/03/2019      Objective    There were no vitals taken for this visit. BP Readings from Last 3 Encounters:  05/15/20 (!) 148/82  04/15/20 (!) 152/86  09/26/19 131/76   Wt Readings from Last 3 Encounters:  05/15/20 136 lb 3.2 oz (61.8 kg)  04/15/20 141 lb (64 kg)  09/26/19 144 lb 3.2 oz (65.4 kg)      Physical Exam Vitals reviewed.  Constitutional:      Appearance: Normal appearance. She is well-developed.  HENT:     Head: Normocephalic and atraumatic.  Eyes:     Comments: Was hard to completely visualize the eye closely due to pixelation of the video quality, however, could tell the right eye had redness in the surrounding tissues as well as mild swelling of the upper eyelid and just lateral to the  upper eyelid  Pulmonary:     Effort: Pulmonary effort is normal. No respiratory distress.  Musculoskeletal:     Cervical back: Normal range of motion and neck supple.  Neurological:     Mental Status: She is alert.  Psychiatric:        Mood and Affect: Mood normal.        Behavior: Behavior normal.        Thought Content: Thought content normal.        Judgment: Judgment normal.        Assessment & Plan     1. Acute bacterial conjunctivitis of both eyes - Worsening and now with green, purulent drainage worrisome for bacterial source, symptoms also starting in left eye as well - Ofloxacin drops provided as below, advised to clean tip with alcohol wipe before using in the other eye and between each use - Strict hand hygiene - Seek in person evaluation if worsening, not improving or if eye pain with movement or double vision develops. - ofloxacin (OCUFLOX) 0.3 % ophthalmic solution; Place 1 drop into both eyes 4 (four) times daily. X 5-7 days  Dispense: 5 mL; Refill: 0   No follow-ups on file.     I discussed the assessment and treatment plan with the patient. The  patient was provided an opportunity to ask questions and all were answered. The patient agreed with the plan and demonstrated an understanding of the instructions.   The patient was advised to call back or seek an in-person evaluation if the symptoms worsen or if the condition fails to improve as anticipated.  I provided 14 minutes of face-to-face time during this encounter via MyChart Video enabled encounter.   [place] [phone] (phone) [phone] (fax)  [place]

## 2020-06-10 NOTE — Telephone Encounter (Signed)
Pt called in on 06/10/2020 stating that her eye and eyelid is swollen and red with green discharge along eye.

## 2020-06-10 NOTE — Telephone Encounter (Signed)
Received a fax from [facility] pt requesting refill for Hydroxyzine. Saw medication was already pended by provider.

## 2020-06-10 NOTE — Telephone Encounter (Signed)
Call pt  Please see if we have any appts in the next 24 hours  Please advise urgent care

## 2020-06-14 ENCOUNTER — Other Ambulatory Visit: Payer: Self-pay

## 2020-06-14 ENCOUNTER — Telehealth: Payer: Medicaid Other | Admitting: Physician Assistant

## 2020-06-14 ENCOUNTER — Encounter: Payer: Self-pay | Admitting: Physician Assistant

## 2020-06-14 DIAGNOSIS — B9689 Other specified bacterial agents as the cause of diseases classified elsewhere: Secondary | ICD-10-CM

## 2020-06-14 DIAGNOSIS — J028 Acute pharyngitis due to other specified organisms: Secondary | ICD-10-CM

## 2020-06-14 MED ORDER — AMOXICILLIN 500 MG PO CAPS: 500.0000 mg | ORAL_CAPSULE | Freq: Two times a day (BID) | ORAL | 0 refills | Status: AC

## 2020-06-14 NOTE — Progress Notes (Signed)
[name] are scheduled for a virtual visit with your provider today.    Just as we do with appointments in the office, we must obtain your consent to participate.  Your consent will be active for this visit and any virtual visit you may have with one of our providers in the next 365 days.    If you have a MyChart account, I can also send a copy of this consent to you electronically.  All virtual visits are billed to your insurance company just like a traditional visit in the office.  As this is a virtual visit, video technology does not allow for your provider to perform a traditional examination.  This may limit your provider's ability to fully assess your condition.  If your provider identifies any concerns that need to be evaluated in person or the need to arrange testing such as labs, EKG, etc, we will make arrangements to do so.    Although advances in technology are sophisticated, we cannot ensure that it will always work on either your end or our end.  If the connection with a video visit is poor, we may have to switch to a telephone visit.  With either a video or telephone visit, we are not always able to ensure that we have a secure connection.   I need to obtain your verbal consent now.   Are you willing to proceed with your visit today?   Ashley Massey has provided verbal consent on 06/14/2020 for a virtual visit (video or telephone).   Mar Daring, PA-C 06/14/2020  8:34 AM    MyChart Video Visit    Virtual Visit via Video Note   This visit type was conducted due to national recommendations for restrictions regarding the COVID-19 Pandemic (e.g. social distancing) in an effort to limit this patient's exposure and mitigate transmission in our community. This patient is at least at moderate risk for complications without adequate follow up. This format is felt to be most appropriate for this patient at this time. Physical exam was limited by quality of the video and audio  technology used for the visit.   Patient location: Home Provider location: Home office in [place]  I discussed the limitations of evaluation and management by telemedicine and the availability of in person appointments. The patient expressed understanding and agreed to proceed.  Patient: Ashley Massey   DOB: 1975/05/18   45 y.o. Female  MRN: [id number] Visit Date: 06/14/2020  Today's healthcare provider: Mar Daring, PA-C   No chief complaint on file.  Subjective    Sore Throat  This is a new problem. The current episode started in the past 7 days (was seen for conjunctivitis on 06/10/20, and reports sore throat was present 3-4 days before that but she forgot to mention it). The problem has been unchanged. The pain is worse on the left side. There has been no fever. The pain is mild. Associated symptoms include congestion, ear pain (left ear pain), a hoarse voice (scratchy) and swollen glands (left). Pertinent negatives include no abdominal pain, coughing, diarrhea, drooling, ear discharge, headaches, shortness of breath, stridor or trouble swallowing. She has had no exposure to strep or mono. She has tried cool liquids, acetaminophen and gargles for the symptoms. The treatment provided no relief.     Patient Active Problem List   Diagnosis Date Noted   Fibroid 05/15/2020   H/O LEEP 05/15/2020   No-show for appointment- 12/26/2019 01/02/2020   Hyperlipidemia 09/26/2019  Hypertension 09/26/2019   Anxiety 09/26/2019   History of chest pain 93/81/8299   Colicky RUQ abdominal pain 07/11/2019   Elevated SGOT (AST) 07/11/2019   Past Medical History:  Diagnosis Date   Active labor 01/25/2012   GBS (group B Streptococcus carrier), +RV culture, currently pregnant 01/25/2012   Hypertension       Medications: Outpatient Medications Prior to Visit  Medication Sig   buPROPion (WELLBUTRIN SR) 150 MG 12 hr tablet Take 1 tablet (150 mg total) by mouth 2 (two) times daily. Please  schedule office visit before any future refills. (Patient not taking: Reported on 04/15/2020)   escitalopram (LEXAPRO) 5 MG tablet Take 1 tablet (5 mg total) by mouth daily. (Patient not taking: Reported on 05/15/2020)   hydrochlorothiazide (HYDRODIURIL) 25 MG tablet Take 1 tablet (25 mg total) by mouth daily.   hydrOXYzine (ATARAX/VISTARIL) 50 MG tablet Take 1 tablet by mouth every 8 hours as needed for anxiety. May cause drowsiness, no driving or legal decisions with medication.   lisinopril (ZESTRIL) 5 MG tablet Take 1 tablet (5 mg total) by mouth daily.   medroxyPROGESTERone (DEPO-PROVERA) 150 MG/ML injection Inject 150 mg into the muscle every 3 (three) months.   ofloxacin (OCUFLOX) 0.3 % ophthalmic solution Place 1 drop into both eyes 4 (four) times daily. X 5-7 days   rosuvastatin (CRESTOR) 5 MG tablet Take 1 tablet by mouth once daily.   No facility-administered medications prior to visit.    Review of Systems  Constitutional:  Negative for appetite change, chills, fatigue and fever.  HENT:  Positive for congestion, ear pain (left ear pain), hoarse voice (scratchy) and sore throat. Negative for drooling, ear discharge and trouble swallowing.   Respiratory:  Negative for cough, shortness of breath and stridor.   Cardiovascular: Negative.   Gastrointestinal:  Negative for abdominal pain and diarrhea.  Neurological:  Negative for headaches.  Hematological:  Positive for adenopathy.   Last CBC Lab Results  Component Value Date   WBC 6.5 04/19/2020   HGB 14.4 04/19/2020   HCT 43.4 04/19/2020   MCV 90 04/19/2020   MCH 29.8 04/19/2020   RDW 12.9 04/19/2020   PLT 290 37/16/9678   Last metabolic panel Lab Results  Component Value Date   GLUCOSE 90 04/19/2020   NA 137 04/19/2020   K 3.9 04/19/2020   CL 98 04/19/2020   CO2 18 (L) 04/19/2020   BUN 10 04/19/2020   CREATININE 0.76 04/19/2020   GFRNONAA 81 07/17/2019   GFRAA 93 07/17/2019   CALCIUM 10.1 04/19/2020   PROT 7.0  04/19/2020   ALBUMIN 4.8 04/19/2020   LABGLOB 2.2 04/19/2020   AGRATIO 2.2 04/19/2020   BILITOT 0.4 04/19/2020   ALKPHOS 50 04/19/2020   AST 17 04/19/2020   ALT 14 04/19/2020   ANIONGAP 16 (H) 07/03/2019      Objective    There were no vitals taken for this visit. BP Readings from Last 3 Encounters:  05/15/20 (!) 148/82  04/15/20 (!) 152/86  09/26/19 131/76   Wt Readings from Last 3 Encounters:  05/15/20 136 lb 3.2 oz (61.8 kg)  04/15/20 141 lb (64 kg)  09/26/19 144 lb 3.2 oz (65.4 kg)      Physical Exam Vitals reviewed.  Constitutional:      General: She is not in acute distress.    Appearance: Normal appearance. She is well-developed. She is not ill-appearing.  HENT:     Head: Normocephalic and atraumatic.  Pulmonary:     Effort:  Pulmonary effort is normal. No respiratory distress.  Musculoskeletal:     Cervical back: Normal range of motion and neck supple.  Neurological:     Mental Status: She is alert.  Psychiatric:        Mood and Affect: Mood normal.        Behavior: Behavior normal.        Thought Content: Thought content normal.        Judgment: Judgment normal.       Assessment & Plan     1. Bacterial pharyngitis - Not improving and present almost 8-10 days.  - Suspect possible bacterial source - Will treat with Amoxil as below - Salt water gargles and chloraseptic spray can be used for pain and inflammation - Tylenol and/or ibuprofen for fevers and pain - Seek in-person evaluation if not improving or if symptoms worsen. - amoxicillin (AMOXIL) 500 MG capsule; Take 1 capsule (500 mg total) by mouth 2 (two) times daily for 7 days.  Dispense: 14 capsule; Refill: 0   No follow-ups on file.     I discussed the assessment and treatment plan with the patient. The patient was provided an opportunity to ask questions and all were answered. The patient agreed with the plan and demonstrated an understanding of the instructions.   The patient was advised  to call back or seek an in-person evaluation if the symptoms worsen or if the condition fails to improve as anticipated.  I provided 10 minutes of face-to-face time during this encounter via MyChart Video enabled encounter.   [place] [phone] (phone) [phone] (fax)  [place]

## 2020-06-14 NOTE — Patient Instructions (Signed)
Pharyngitis  Pharyngitis is redness, pain, and swelling (inflammation) of the throat (pharynx). It is a very common cause of sore throat. Pharyngitis can be caused by a bacteria, but it is usually caused by a virus. Most cases of pharyngitis getbetter on their own without treatment. What are the causes? This condition may be caused by: Infection by viruses (viral). Viral pharyngitis spreads from person to person (is contagious) through coughing, sneezing, and sharing of personal items or utensils such as cups, forks, spoons, and toothbrushes. Infection by bacteria (bacterial). Bacterial pharyngitis may be spread by touching the nose or face after coming in contact with the bacteria, or through more intimate contact, such as kissing. Allergies. Allergies can cause buildup of mucus in the throat (post-nasal drip), leading to inflammation and irritation. Allergies can also cause blocked nasal passages, forcing breathing through the mouth, which dries and irritates the throat. What increases the risk? You are more likely to develop this condition if: You are 32-14 years old. You are exposed to crowded environments such as daycare, school, or dormitory living. You live in a cold climate. You have a weakened disease-fighting (immune) system. What are the signs or symptoms? Symptoms of this condition vary by the cause (viral, bacterial, or allergies) and can include: Sore throat. Fatigue. Low-grade fever. Headache. Joint pain and muscle aches. Skin rashes. Swollen glands in the throat (lymph nodes). Plaque-like film on the throat or tonsils. This is often a symptom of bacterial pharyngitis. Vomiting. Stuffy nose (nasal congestion). Cough. Red, itchy eyes (conjunctivitis). Loss of appetite. How is this diagnosed? This condition is often diagnosed based on your medical history and a physical exam. Your health care provider will ask you questions about your illness and your symptoms. A swab of  your throat may be done to check for bacteria (rapid strep test). Other lab tests may also be done, depending on the suspected cause, butthese are rare. How is this treated? This condition usually gets better in 3-4 days without medicine. Bacterialpharyngitis may be treated with antibiotic medicines. Follow these instructions at home: Take over-the-counter and prescription medicines only as told by your health care provider. If you were prescribed an antibiotic medicine, take it as told by your health care provider. Do not stop taking the antibiotic even if you start to feel better. Do not give children aspirin because of the association with Reye syndrome. Drink enough water and fluids to keep your urine clear or pale yellow. Get a lot of rest. Gargle with a salt-water mixture 3-4 times a day or as needed. To make a salt-water mixture, completely dissolve -1 tsp of salt in 1 cup of warm water. Do not swallow this mixture. If your health care provider approves, you may use throat lozenges or sprays to soothe your throat. Contact a health care provider if: You have large, tender lumps in your neck. You have a rash. You cough up green, yellow-brown, or bloody spit. Get help right away if: Your neck becomes stiff. You drool or are unable to swallow liquids. You cannot drink or take medicines without vomiting. You have severe pain that does not go away, even after you take medicine. You have trouble breathing, and it is not caused by a stuffy nose. You have new pain and swelling in your joints such as the knees, ankles, wrists, or elbows. Summary Pharyngitis is redness, pain, and swelling (inflammation) of the throat (pharynx). While pharyngitis can be caused by a bacteria, the most common causes are viral. Most cases  of pharyngitis get better on their own without treatment. Bacterial pharyngitis is treated with antibiotic medicines. This information is not intended to replace advice given  to you by your health care provider. Make sure you discuss any questions you have with your healthcare provider. Document Revised: 11/23/2019 Document Reviewed: 11/23/2019 Elsevier Patient Education  2022 [employer].

## 2020-07-02 ENCOUNTER — Encounter: Payer: Self-pay | Admitting: Adult Health

## 2020-07-05 DIAGNOSIS — Z419 Encounter for procedure for purposes other than remedying health state, unspecified: Secondary | ICD-10-CM | POA: Diagnosis not present

## 2020-07-10 ENCOUNTER — Other Ambulatory Visit: Payer: Self-pay

## 2020-07-10 ENCOUNTER — Telehealth: Payer: Self-pay

## 2020-07-10 DIAGNOSIS — I10 Essential (primary) hypertension: Secondary | ICD-10-CM

## 2020-07-10 DIAGNOSIS — F419 Anxiety disorder, unspecified: Secondary | ICD-10-CM

## 2020-07-10 MED ORDER — ESCITALOPRAM OXALATE 5 MG PO TABS: 5.0000 mg | ORAL_TABLET | Freq: Every day | ORAL | 0 refills | Status: DC

## 2020-07-10 MED ORDER — LISINOPRIL 5 MG PO TABS
5.0000 mg | ORAL_TABLET | Freq: Every day | ORAL | 0 refills | Status: DC
Start: 2020-07-10 — End: 2020-10-16

## 2020-07-10 NOTE — Telephone Encounter (Signed)
Lisinopril and Lexapro have been refilled for 90 days. Pt has an upcoming appointment with Laverna Peace on 09/2020

## 2020-07-10 NOTE — Telephone Encounter (Signed)
Pt needs refill on lisinopril (ZESTRIL) 5 MG tablet and escitalopram (LEXAPRO) 5 MG tablet

## 2020-07-15 ENCOUNTER — Ambulatory Visit: Payer: Self-pay | Admitting: Adult Health

## 2020-07-16 DIAGNOSIS — Z3042 Encounter for surveillance of injectable contraceptive: Secondary | ICD-10-CM | POA: Diagnosis not present

## 2020-07-22 ENCOUNTER — Inpatient Hospital Stay: Admission: RE | Admit: 2020-07-22 | Payer: Medicaid Other | Source: Ambulatory Visit

## 2020-08-05 DIAGNOSIS — Z419 Encounter for procedure for purposes other than remedying health state, unspecified: Secondary | ICD-10-CM | POA: Diagnosis not present

## 2020-08-19 ENCOUNTER — Other Ambulatory Visit: Payer: Self-pay

## 2020-08-19 ENCOUNTER — Telehealth: Payer: Self-pay | Admitting: Adult Health

## 2020-08-19 DIAGNOSIS — I10 Essential (primary) hypertension: Secondary | ICD-10-CM

## 2020-08-19 NOTE — Telephone Encounter (Signed)
[facility] faxed refill request for the following medications:   hydrochlorothiazide (HYDRODIURIL) 25 MG tablet   Please advise.

## 2020-08-20 ENCOUNTER — Other Ambulatory Visit: Payer: Self-pay | Admitting: Adult Health

## 2020-08-20 DIAGNOSIS — F419 Anxiety disorder, unspecified: Secondary | ICD-10-CM

## 2020-08-22 ENCOUNTER — Encounter: Payer: Self-pay | Admitting: Adult Health

## 2020-08-28 ENCOUNTER — Other Ambulatory Visit: Payer: Self-pay | Admitting: Family

## 2020-08-28 ENCOUNTER — Telehealth: Payer: Self-pay | Admitting: Adult Health

## 2020-08-28 DIAGNOSIS — F419 Anxiety disorder, unspecified: Secondary | ICD-10-CM

## 2020-08-28 MED ORDER — ESCITALOPRAM OXALATE 10 MG PO TABS: 5.0000 mg | ORAL_TABLET | Freq: Every day | ORAL | 0 refills | Status: DC

## 2020-08-28 NOTE — Telephone Encounter (Signed)
Patient was placed on Lexapro 5 mg by michelle but was informed by Sharyn Lull at her appointment 04/2020 that she could increase the medication to 10 mg if need be.   Patient has been taking this medication two 5 mg pills for a total of 10 mg daily and has run out of medication early.   Please advise, Patient would like a refill of 10 mg Lexapro

## 2020-09-02 NOTE — Telephone Encounter (Signed)
Pt called office back. Pt states she will wait to F/U at her appt with [facility].

## 2020-09-02 NOTE — Telephone Encounter (Signed)
Placed call to pt to inform of new dose being sent in and to schedule F/U. LMTCB

## 2020-09-05 DIAGNOSIS — Z419 Encounter for procedure for purposes other than remedying health state, unspecified: Secondary | ICD-10-CM | POA: Diagnosis not present

## 2020-09-26 ENCOUNTER — Ambulatory Visit: Payer: Self-pay | Admitting: Adult Health

## 2020-09-26 ENCOUNTER — Other Ambulatory Visit: Payer: Self-pay | Admitting: Adult Health

## 2020-09-27 ENCOUNTER — Other Ambulatory Visit: Payer: Self-pay

## 2020-09-27 MED ORDER — ROSUVASTATIN CALCIUM 5 MG PO TABS: 5.0000 mg | ORAL_TABLET | Freq: Every day | ORAL | 0 refills | Status: DC

## 2020-10-01 DIAGNOSIS — Z3042 Encounter for surveillance of injectable contraceptive: Secondary | ICD-10-CM | POA: Diagnosis not present

## 2020-10-03 ENCOUNTER — Ambulatory Visit: Payer: Medicaid Other | Admitting: Adult Health

## 2020-10-05 DIAGNOSIS — Z419 Encounter for procedure for purposes other than remedying health state, unspecified: Secondary | ICD-10-CM | POA: Diagnosis not present

## 2020-10-12 ENCOUNTER — Other Ambulatory Visit: Payer: Self-pay | Admitting: Adult Health

## 2020-10-12 DIAGNOSIS — I10 Essential (primary) hypertension: Secondary | ICD-10-CM

## 2020-10-16 ENCOUNTER — Other Ambulatory Visit: Payer: Self-pay

## 2020-10-16 DIAGNOSIS — F419 Anxiety disorder, unspecified: Secondary | ICD-10-CM

## 2020-10-16 DIAGNOSIS — I10 Essential (primary) hypertension: Secondary | ICD-10-CM

## 2020-10-16 MED ORDER — LISINOPRIL 5 MG PO TABS
5.0000 mg | ORAL_TABLET | Freq: Every day | ORAL | 0 refills | Status: DC
Start: 2020-10-16 — End: 2020-12-11

## 2020-10-16 MED ORDER — ESCITALOPRAM OXALATE 10 MG PO TABS: 10.0000 mg | ORAL_TABLET | Freq: Every day | ORAL | 0 refills | Status: DC

## 2020-10-17 ENCOUNTER — Ambulatory Visit: Payer: Medicaid Other | Admitting: Adult Health

## 2020-11-05 DIAGNOSIS — Z419 Encounter for procedure for purposes other than remedying health state, unspecified: Secondary | ICD-10-CM | POA: Diagnosis not present

## 2020-11-06 ENCOUNTER — Ambulatory Visit: Payer: Medicaid Other | Admitting: Adult Health

## 2020-12-04 ENCOUNTER — Telehealth: Payer: Self-pay | Admitting: Cardiology

## 2020-12-04 DIAGNOSIS — I10 Essential (primary) hypertension: Secondary | ICD-10-CM

## 2020-12-04 MED ORDER — HYDROCHLOROTHIAZIDE 25 MG PO TABS: 25.0000 mg | ORAL_TABLET | Freq: Every day | ORAL | 0 refills | Status: DC

## 2020-12-04 NOTE — Telephone Encounter (Signed)
Pt is establishing care with [facility] with Sharyn Lull [place]. Pt was seen before by Sharyn Lull at [facility]. Pt has a new pt appt scheduled for Dec. 7th at 9AM  Pt is requesting refill for hydrochlorothiazide (HYDRODIURIL) 25 MG tablet Pt states she only has one pill left and wanted to know if her prescription could at least be filled enough until her appt. Pt uses [facility] on [address] in [place].

## 2020-12-05 DIAGNOSIS — Z419 Encounter for procedure for purposes other than remedying health state, unspecified: Secondary | ICD-10-CM | POA: Diagnosis not present

## 2020-12-11 ENCOUNTER — Other Ambulatory Visit: Payer: Self-pay

## 2020-12-11 ENCOUNTER — Ambulatory Visit (INDEPENDENT_AMBULATORY_CARE_PROVIDER_SITE_OTHER): Payer: Medicaid Other | Admitting: Adult Health

## 2020-12-11 ENCOUNTER — Other Ambulatory Visit: Payer: Self-pay | Admitting: Adult Health

## 2020-12-11 ENCOUNTER — Encounter: Payer: Self-pay | Admitting: Adult Health

## 2020-12-11 VITALS — BP 140/88 | HR 89 | Temp 97.0°F | Ht 61.75 in | Wt 146.0 lb

## 2020-12-11 DIAGNOSIS — Z6826 Body mass index (BMI) 26.0-26.9, adult: Secondary | ICD-10-CM | POA: Diagnosis not present

## 2020-12-11 DIAGNOSIS — E559 Vitamin D deficiency, unspecified: Secondary | ICD-10-CM

## 2020-12-11 DIAGNOSIS — E569 Vitamin deficiency, unspecified: Secondary | ICD-10-CM | POA: Diagnosis not present

## 2020-12-11 DIAGNOSIS — F419 Anxiety disorder, unspecified: Secondary | ICD-10-CM

## 2020-12-11 DIAGNOSIS — Z1231 Encounter for screening mammogram for malignant neoplasm of breast: Secondary | ICD-10-CM | POA: Diagnosis not present

## 2020-12-11 DIAGNOSIS — I10 Essential (primary) hypertension: Secondary | ICD-10-CM | POA: Diagnosis not present

## 2020-12-11 LAB — CBC WITH DIFFERENTIAL/PLATELET
Basophils Absolute: 0 10*3/uL (ref 0.0–0.1)
Basophils Relative: 0.4 % (ref 0.0–3.0)
Eosinophils Absolute: 0.2 10*3/uL (ref 0.0–0.7)
Eosinophils Relative: 3 % (ref 0.0–5.0)
HCT: 43.2 % (ref 36.0–46.0)
Hemoglobin: 14.5 g/dL (ref 12.0–15.0)
Lymphocytes Relative: 10.3 % — ABNORMAL LOW (ref 12.0–46.0)
Lymphs Abs: 0.8 10*3/uL (ref 0.7–4.0)
MCHC: 33.5 g/dL (ref 30.0–36.0)
MCV: 92.9 fl (ref 78.0–100.0)
Monocytes Absolute: 0.7 10*3/uL (ref 0.1–1.0)
Monocytes Relative: 9.9 % (ref 3.0–12.0)
Neutro Abs: 5.7 10*3/uL (ref 1.4–7.7)
Neutrophils Relative %: 76.4 % (ref 43.0–77.0)
Platelets: 281 10*3/uL (ref 150.0–400.0)
RBC: 4.65 Mil/uL (ref 3.87–5.11)
RDW: 12.7 % (ref 11.5–15.5)
WBC: 7.4 10*3/uL (ref 4.0–10.5)

## 2020-12-11 LAB — COMPREHENSIVE METABOLIC PANEL
ALT: 15 U/L (ref 0–35)
AST: 17 U/L (ref 0–37)
Albumin: 4 g/dL (ref 3.5–5.2)
Alkaline Phosphatase: 37 U/L — ABNORMAL LOW (ref 39–117)
BUN: 10 mg/dL (ref 6–23)
CO2: 26 mEq/L (ref 19–32)
Calcium: 9.8 mg/dL (ref 8.4–10.5)
Chloride: 102 mEq/L (ref 96–112)
Creatinine, Ser: 0.61 mg/dL (ref 0.40–1.20)
GFR: 109.84 mL/min (ref >60.00)
Glucose, Bld: 97 mg/dL (ref 70–99)
Potassium: 3.8 mEq/L (ref 3.5–5.1)
Sodium: 136 mEq/L (ref 135–145)
Total Bilirubin: 0.5 mg/dL (ref 0.2–1.2)
Total Protein: 6.3 g/dL (ref 6.0–8.3)

## 2020-12-11 LAB — LIPID PANEL
Cholesterol: 209 mg/dL — ABNORMAL HIGH (ref 0–200)
HDL: 76.2 mg/dL (ref >39.00)
LDL Cholesterol: 97 mg/dL (ref 0–99)
NonHDL: 132.41
Total CHOL/HDL Ratio: 3
Triglycerides: 176 mg/dL — ABNORMAL HIGH (ref 0.0–149.0)
VLDL: 35.2 mg/dL (ref 0.0–40.0)

## 2020-12-11 LAB — VITAMIN D 25 HYDROXY (VIT D DEFICIENCY, FRACTURES): VITD: 14.78 ng/mL — ABNORMAL LOW (ref 30.00–100.00)

## 2020-12-11 LAB — TSH: TSH: 1.63 u[IU]/mL (ref 0.35–5.50)

## 2020-12-11 MED ORDER — LISINOPRIL 10 MG PO TABS: 10.0000 mg | ORAL_TABLET | Freq: Every day | ORAL | 3 refills | Status: DC

## 2020-12-11 MED ORDER — ESCITALOPRAM OXALATE 5 MG PO TABS: 15.0000 mg | ORAL_TABLET | Freq: Every day | ORAL | 2 refills | Status: DC

## 2020-12-11 MED ORDER — HYDROCHLOROTHIAZIDE 25 MG PO TABS: 12.5000 mg | ORAL_TABLET | Freq: Every day | ORAL | 0 refills | Status: AC

## 2020-12-11 MED ORDER — VITAMIN D (ERGOCALCIFEROL) 1.25 MG (50000 UNIT) PO CAPS: 50000.0000 [IU] | ORAL_CAPSULE | ORAL | 0 refills | Status: DC

## 2020-12-11 NOTE — Progress Notes (Signed)
Vitamin  D is low, this can contribute to poor sleep and fatigue, will send in prescription for Vitamin D at 50,000 units by mouth once every 7 days/(once weekly) for 12 weeks. Advise recheck lab Vitamin D in 1-2 weeks after completing vitamin d prescription. Labs need to be scheduled.   Vitamin D, Ergocalciferol, (DRISDOL) 1.25 MG (50000 UNIT) CAPS capsule   Sig: Take 1 capsule (50,000 Units total) by mouth every 7 (seven) days. (taking one tablet per week) scheduled Vitamin D lab in  1-2 weeks after completing prescription.   Dispense:  12 capsule   Refill:  0 TSh within normal limits.    Total cholesterol and LDL elevated.  Discuss lifestyle modification with patient e.g. increase exercise, fiber, fruits, vegetables, lean meat, and omega 3/fish intake and decrease saturated fat.  If patient following strict diet and exercise program already please schedule follow up appointment with primary care physician.  CBC ok. CMP ok. Recheck  lipid panel, cbc and cmp fasting in 3 months with vitamin D.

## 2020-12-11 NOTE — Patient Instructions (Addendum)
Call to schedule your screening mammogram. Your orders have been placed for your exam.  Let our office know if you have questions, concerns, or any difficulty scheduling.  If normal results then yearly screening mammograms are recommended unless you notice  Changes in your breast then you should schedule a follow up office visit. If abnormal results  Further imaging will be warranted and sooner follow up as determined by the radiologist at the [facility].   [facility] [place], [place] [address]  Main: [phone]      Hypertension, Adult High blood pressure (hypertension) is when the force of blood pumping through the arteries is too strong. The arteries are the blood vessels that carry blood from the heart throughout the body. Hypertension forces the heart to work harder to pump blood and may cause arteries to become narrow or stiff. Untreated or uncontrolled hypertension can cause a heart attack, heart failure, a stroke, kidney disease, and other problems. A blood pressure reading consists of a higher number over a lower number. Ideally, your blood pressure should be below 120/80. The first ("top") number is called the systolic pressure. It is a measure of the pressure in your arteries as your heart beats. The second ("bottom") number is called the diastolic pressure. It is a measure of the pressure in your arteries as the heart relaxes. What are the causes? The exact cause of this condition is not known. There are some conditions that result in or are related to high blood pressure. What increases the risk? Some risk factors for high blood pressure are under your control. The following factors may make you more likely to develop this condition: Smoking. Having type 2 diabetes mellitus, high cholesterol, or both. Not getting enough exercise or physical activity. Being overweight. Having too much fat, sugar, calories, or salt (sodium)  in your diet. Drinking too much alcohol. Some risk factors for high blood pressure may be difficult or impossible to change. Some of these factors include: Having chronic kidney disease. Having a family history of high blood pressure. Age. Risk increases with age. Race. You may be at higher risk if you are African American. Gender. Men are at higher risk than women before age 57. After age 75, women are at higher risk than men. Having obstructive sleep apnea. Stress. What are the signs or symptoms? High blood pressure may not cause symptoms. Very high blood pressure (hypertensive crisis) may cause: Headache. Anxiety. Shortness of breath. Nosebleed. Nausea and vomiting. Vision changes. Severe chest pain. Seizures. How is this diagnosed? This condition is diagnosed by measuring your blood pressure while you are seated, with your arm resting on a flat surface, your legs uncrossed, and your feet flat on the floor. The cuff of the blood pressure monitor will be placed directly against the skin of your upper arm at the level of your heart. It should be measured at least twice using the same arm. Certain conditions can cause a difference in blood pressure between your right and left arms. Certain factors can cause blood pressure readings to be lower or higher than normal for a short period of time: When your blood pressure is higher when you are in a health care provider's office than when you are at home, this is called white coat hypertension. Most people with this condition do not need medicines. When your blood pressure is higher at home than when you are in a health care provider's office, this is  called masked hypertension. Most people with this condition may need medicines to control blood pressure. If you have a high blood pressure reading during one visit or you have normal blood pressure with other risk factors, you may be asked to: Return on a different day to have your blood pressure  checked again. Monitor your blood pressure at home for 1 week or longer. If you are diagnosed with hypertension, you may have other blood or imaging tests to help your health care provider understand your overall risk for other conditions. How is this treated? This condition is treated by making healthy lifestyle changes, such as eating healthy foods, exercising more, and reducing your alcohol intake. Your health care provider may prescribe medicine if lifestyle changes are not enough to get your blood pressure under control, and if: Your systolic blood pressure is above 130. Your diastolic blood pressure is above 80. Your personal target blood pressure may vary depending on your medical conditions, your age, and other factors. Follow these instructions at home: Eating and drinking  Eat a diet that is high in fiber and potassium, and low in sodium, added sugar, and fat. An example eating plan is called the DASH (Dietary Approaches to Stop Hypertension) diet. To eat this way: Eat plenty of fresh fruits and vegetables. Try to fill one half of your plate at each meal with fruits and vegetables. Eat whole grains, such as whole-wheat pasta, brown rice, or whole-grain bread. Fill about one fourth of your plate with whole grains. Eat or drink low-fat dairy products, such as skim milk or low-fat yogurt. Avoid fatty cuts of meat, processed or cured meats, and poultry with skin. Fill about one fourth of your plate with lean proteins, such as fish, chicken without skin, beans, eggs, or tofu. Avoid pre-made and processed foods. These tend to be higher in sodium, added sugar, and fat. Reduce your daily sodium intake. Most people with hypertension should eat less than 1,500 mg of sodium a day. Do not drink alcohol if: Your health care provider tells you not to drink. You are pregnant, may be pregnant, or are planning to become pregnant. If you drink alcohol: Limit how much you use to: 0-1 drink a day for  women. 0-2 drinks a day for men. Be aware of how much alcohol is in your drink. In the [place] one drink equals one 12 oz bottle of beer (355 mL), one 5 oz glass of wine (148 mL), or one 1 oz glass of hard liquor (44 mL). Lifestyle  Work with your health care provider to maintain a healthy body weight or to lose weight. Ask what an ideal weight is for you. Get at least 30 minutes of exercise most days of the week. Activities may include walking, swimming, or biking. Include exercise to strengthen your muscles (resistance exercise), such as Pilates or lifting weights, as part of your weekly exercise routine. Try to do these types of exercises for 30 minutes at least 3 days a week. Do not use any products that contain nicotine or tobacco, such as cigarettes, e-cigarettes, and chewing tobacco. If you need help quitting, ask your health care provider. Monitor your blood pressure at home as told by your health care provider. Keep all follow-up visits as told by your health care provider. This is important. Medicines Take over-the-counter and prescription medicines only as told by your health care provider. Follow directions carefully. Blood pressure medicines must be taken as prescribed. Do not skip doses of blood pressure medicine.  Doing this puts you at risk for problems and can make the medicine less effective. Ask your health care provider about side effects or reactions to medicines that you should watch for. Contact a health care provider if you: Think you are having a reaction to a medicine you are taking. Have headaches that keep coming back (recurring). Feel dizzy. Have swelling in your ankles. Have trouble with your vision. Get help right away if you: Develop a severe headache or confusion. Have unusual weakness or numbness. Feel faint. Have severe pain in your chest or abdomen. Vomit repeatedly. Have trouble breathing. Summary Hypertension is when the force of blood pumping through  your arteries is too strong. If this condition is not controlled, it may put you at risk for serious complications. Your personal target blood pressure may vary depending on your medical conditions, your age, and other factors. For most people, a normal blood pressure is less than 120/80. Hypertension is treated with lifestyle changes, medicines, or a combination of both. Lifestyle changes include losing weight, eating a healthy, low-sodium diet, exercising more, and limiting alcohol. This information is not intended to replace advice given to you by your health care provider. Make sure you discuss any questions you have with your health care provider. Document Revised: 09/01/2017 Document Reviewed: 09/01/2017 Elsevier Patient Education  [place].

## 2020-12-11 NOTE — Progress Notes (Signed)
Meds ordered this encounter  Medications   Vitamin D, Ergocalciferol, (DRISDOL) 1.25 MG (50000 UNIT) CAPS capsule    Sig: Take 1 capsule (50,000 Units total) by mouth every 7 (seven) days. (taking one tablet per week) scheduled Vitamin D lab in  1-2 weeks after completing prescription.    Dispense:  12 capsule    Refill:  0    Orders Placed This Encounter  Procedures   VITAMIN D 25 Hydroxy (Vit-D Deficiency, Fractures)    Standing Status:   Future    Standing Expiration Date:   12/11/2021

## 2020-12-11 NOTE — Progress Notes (Signed)
New Patient Office Visit  Subjective:  Patient ID: Ashley Massey, female    DOB: 1975/02/27  Age: 45 y.o. MRN: [id number]  CC:  Chief Complaint  Patient presents with   Establish Care    Declined flu shot.    HPI [employer] presents for follow up. Blood pressure has been elevated 156/102- 140/88 today in office. She did not go to follow up with cardiology. She is taking HCTZ 25 mg and lisinopril 5 mg.  She does not endorse any chest pain, shortness of breath or neck pain any radiation of pain.  She feels well other than her anxiety has increased.  She does not denies any suicidal or homicidal ideations or intents. On lexapro she has still anxiety type symptoms, and is on 10mg , for past 3 months. Feels still not quite where needs to be.  Patient  denies any fever, body aches,chills, rash, chest pain, shortness of breath, nausea, vomiting, or diarrhea.  Denies any pregnancy   Pap 05/15/20 with women[facility] care.  Past Medical History:  Diagnosis Date   Active labor 01/25/2012   GBS (group B Streptococcus carrier), +RV culture, currently pregnant 01/25/2012   Hypertension     Past Surgical History:  Procedure Laterality Date   UTERINE FIBROID SURGERY      Family History  Problem Relation Age of Onset   Hypertension Mother    Hypertension Father    Healthy Sister    Hypertension Maternal Grandmother    Hypertension Maternal Grandfather    Heart disease Paternal Grandmother    Hypertension Paternal Grandmother    Hypertension Paternal Grandfather     Social History   Socioeconomic History   Marital status: Single    Spouse name: Not on file   Number of children: Not on file   Years of education: Not on file   Highest education level: Not on file  Occupational History   Not on file  Tobacco Use   Smoking status: Never   Smokeless tobacco: Never  Vaping Use   Vaping Use: Former  Substance and Sexual Activity   Alcohol use: Yes    Alcohol/week: 12.0 standard  drinks    Types: 12 Cans of beer per week    Comment: daily (1-2)   Drug use: Never   Sexual activity: Yes    Birth control/protection: Injection  Other Topics Concern   Not on file  Social History Narrative   Not on file   Social Determinants of Health   Financial Resource Strain: Not on file  Food Insecurity: Not on file  Transportation Needs: Not on file  Physical Activity: Not on file  Stress: Not on file  Social Connections: Not on file  Intimate Partner Violence: Not on file    ROS Review of Systems  Constitutional: Negative.   HENT: Negative.    Respiratory: Negative.    Cardiovascular: Negative.   Gastrointestinal: Negative.   Genitourinary: Negative.   Musculoskeletal: Negative.   Psychiatric/Behavioral:  Positive for decreased concentration. Negative for self-injury, sleep disturbance and suicidal ideas. The patient is nervous/anxious.    Objective:   Today's Vitals: BP 140/88   Pulse 89   Temp (!) 97 F (36.1 C) (Temporal)   Ht 5' 1.75" (1.568 m)   Wt 146 lb (66.2 kg)   SpO2 99%   BMI 26.92 kg/m   Vitals with BMI 12/11/2020 05/15/2020 04/15/2020  Height 5' 1.75" 5\' 2"  -  Weight 146 lbs 136 lbs 3 oz 141 lbs  BMI 40.98 11.91 -  Systolic 478 295 621  Diastolic 88 82 86  Pulse 89 69 74    Physical Exam Vitals reviewed.  Constitutional:      General: She is not in acute distress.    Appearance: She is well-developed. She is not diaphoretic.     Interventions: She is not intubated. HENT:     Head: Normocephalic and atraumatic.     Right Ear: External ear normal.     Left Ear: External ear normal.     Nose: Nose normal.     Mouth/Throat:     Pharynx: No oropharyngeal exudate.  Eyes:     General: Lids are normal. No scleral icterus.       Right eye: No discharge.        Left eye: No discharge.     Conjunctiva/sclera: Conjunctivae normal.     Right eye: Right conjunctiva is not injected. No exudate or hemorrhage.    Left eye: Left conjunctiva is  not injected. No exudate or hemorrhage.    Pupils: Pupils are equal, round, and reactive to light.  Neck:     Thyroid: No thyroid mass or thyromegaly.     Vascular: Normal carotid pulses. No carotid bruit, hepatojugular reflux or JVD.     Trachea: Trachea and phonation normal. No tracheal tenderness or tracheal deviation.     Meningeal: Brudzinski's sign and Kernig's sign absent.  Cardiovascular:     Rate and Rhythm: Normal rate and regular rhythm.     Pulses: Normal pulses.          Radial pulses are 2+ on the right side and 2+ on the left side.       Dorsalis pedis pulses are 2+ on the right side and 2+ on the left side.       Posterior tibial pulses are 2+ on the right side and 2+ on the left side.     Heart sounds: Normal heart sounds, S1 normal and S2 normal. Heart sounds not distant. No murmur heard.   No friction rub. No gallop.  Pulmonary:     Effort: Pulmonary effort is normal. No tachypnea, bradypnea, accessory muscle usage or respiratory distress. She is not intubated.     Breath sounds: Normal breath sounds. No stridor. No wheezing or rales.  Chest:     Chest wall: No tenderness.  Abdominal:     General: Bowel sounds are normal. There is no distension or abdominal bruit.     Palpations: Abdomen is soft. There is no shifting dullness, fluid wave, hepatomegaly, splenomegaly, mass or pulsatile mass.     Tenderness: There is no abdominal tenderness. There is no guarding or rebound.     Hernia: No hernia is present.  Musculoskeletal:        General: No tenderness or deformity. Normal range of motion.     Cervical back: Full passive range of motion without pain, normal range of motion and neck supple. No edema, erythema or rigidity. No spinous process tenderness or muscular tenderness. Normal range of motion.  Lymphadenopathy:     Head:     Right side of head: No submental, submandibular, tonsillar, preauricular, posterior auricular or occipital adenopathy.     Left side of head:  No submental, submandibular, tonsillar, preauricular, posterior auricular or occipital adenopathy.     Cervical: No cervical adenopathy.     Right cervical: No superficial, deep or posterior cervical adenopathy.    Left cervical: No superficial, deep or posterior cervical  adenopathy.     Upper Body:     Right upper body: No supraclavicular or pectoral adenopathy.     Left upper body: No supraclavicular or pectoral adenopathy.  Skin:    General: Skin is warm and dry.     Coloration: Skin is not pale.     Findings: No abrasion, bruising, burn, ecchymosis, erythema, lesion, petechiae or rash.     Nails: There is no clubbing.  Neurological:     Mental Status: She is alert and oriented to person, place, and time.     GCS: GCS eye subscore is 4. GCS verbal subscore is 5. GCS motor subscore is 6.     Cranial Nerves: No cranial nerve deficit.     Sensory: No sensory deficit.     Motor: No tremor, atrophy, abnormal muscle tone or seizure activity.     Coordination: Coordination normal.     Gait: Gait normal.     Deep Tendon Reflexes: Reflexes are normal and symmetric. Reflexes normal. Babinski sign absent on the right side. Babinski sign absent on the left side.     Reflex Scores:      Tricep reflexes are 2+ on the right side and 2+ on the left side.      Bicep reflexes are 2+ on the right side and 2+ on the left side.      Brachioradialis reflexes are 2+ on the right side and 2+ on the left side.      Patellar reflexes are 2+ on the right side and 2+ on the left side.      Achilles reflexes are 2+ on the right side and 2+ on the left side. Psychiatric:        Speech: Speech normal.        Behavior: Behavior normal.        Thought Content: Thought content normal.        Judgment: Judgment normal.    Assessment & Plan:   Problem List Items Addressed This Visit       Cardiovascular and Mediastinum   Hypertension   Relevant Medications   lisinopril (ZESTRIL) 10 MG tablet    hydrochlorothiazide (HYDRODIURIL) 25 MG tablet   Other Relevant Orders   Lipid panel     Other   Anxiety   Relevant Medications   escitalopram (LEXAPRO) 5 MG tablet   Other Relevant Orders   CBC with Differential/Platelet   Comprehensive metabolic panel   TSH   Other Visit Diagnoses     Body mass index 26.0-26.9, adult    -  Primary   Screening mammogram for breast cancer       Relevant Orders   MS DIGITAL SCREENING TOMO BILATERAL   Vitamin deficiency       Relevant Orders   VITAMIN D 25 Hydroxy (Vit-D Deficiency, Fractures)       Outpatient Encounter Medications as of 12/11/2020  Medication Sig   escitalopram (LEXAPRO) 5 MG tablet Take 3 tablets (15 mg total) by mouth daily.   hydrOXYzine (ATARAX/VISTARIL) 50 MG tablet Take 1 tablet by mouth every 8 hours as needed for anxiety. May cause drowsiness, no driving or legal decisions with medication.   lisinopril (ZESTRIL) 10 MG tablet Take 1 tablet (10 mg total) by mouth daily.   medroxyPROGESTERone (DEPO-PROVERA) 150 MG/ML injection Inject 150 mg into the muscle every 3 (three) months.   rosuvastatin (CRESTOR) 5 MG tablet Take 1 tablet (5 mg total) by mouth daily.   [DISCONTINUED] escitalopram (LEXAPRO) 10  MG tablet Take 1 tablet (10 mg total) by mouth daily.   [DISCONTINUED] hydrochlorothiazide (HYDRODIURIL) 25 MG tablet Take 1 tablet (25 mg total) by mouth daily.   [DISCONTINUED] lisinopril (ZESTRIL) 5 MG tablet Take 1 tablet (5 mg total) by mouth daily.   hydrochlorothiazide (HYDRODIURIL) 25 MG tablet Take 0.5 tablets (12.5 mg total) by mouth daily.   [DISCONTINUED] buPROPion (WELLBUTRIN SR) 150 MG 12 hr tablet Take 1 tablet (150 mg total) by mouth 2 (two) times daily. Please schedule office visit before any future refills. (Patient not taking: Reported on 04/15/2020)   [DISCONTINUED] ofloxacin (OCUFLOX) 0.3 % ophthalmic solution Place 1 drop into both eyes 4 (four) times daily. X 5-7 days   No facility-administered encounter  medications on file as of 12/11/2020.    Meds ordered this encounter  Medications   escitalopram (LEXAPRO) 5 MG tablet    Sig: Take 3 tablets (15 mg total) by mouth daily.    Dispense:  90 tablet    Refill:  2   lisinopril (ZESTRIL) 10 MG tablet    Sig: Take 1 tablet (10 mg total) by mouth daily.    Dispense:  90 tablet    Refill:  3   hydrochlorothiazide (HYDRODIURIL) 25 MG tablet    Sig: Take 0.5 tablets (12.5 mg total) by mouth daily.    Dispense:  90 tablet    Refill:  0  Medication changes made as above, medications discontinued as below..  Patient will follow-up in 3 months. Discussed known black box warning for anti depression/ anxiety medication. Need to report any behavioral changes right, if any homicidal or suicidal thoughts or ideas seek medical attention right away. Call 911.  She is encouraged to keep a log of her blood pressures and given parameters of 130/80 as a goal blood pressure and also given high and low blood pressures.  Report any dizziness or lightheadedness.  She will need labs today and then recheck CMP in 6 to 8 weeks.  Patient verbalized understanding patient has no questions at this time.  Patient declines counseling.  Medications Discontinued During This Encounter  Medication Reason   buPROPion (WELLBUTRIN SR) 150 MG 12 hr tablet Patient Preference   ofloxacin (OCUFLOX) 0.3 % ophthalmic solution Completed Course   escitalopram (LEXAPRO) 10 MG tablet    lisinopril (ZESTRIL) 5 MG tablet Completed Course   hydrochlorothiazide (HYDRODIURIL) 25 MG tablet     refills  Follow-up: Return in about 3 months (around 03/11/2021), or if symptoms worsen or fail to improve, for Go to Emergency room/ urgent care if worse, at any time for any worsening symptoms.   Marcille Buffy, FNP

## 2020-12-12 ENCOUNTER — Other Ambulatory Visit: Payer: Self-pay

## 2020-12-12 ENCOUNTER — Telehealth: Payer: Self-pay

## 2020-12-12 DIAGNOSIS — E559 Vitamin D deficiency, unspecified: Secondary | ICD-10-CM

## 2020-12-12 NOTE — Telephone Encounter (Signed)
Pt calling in regards to lab results

## 2020-12-12 NOTE — Telephone Encounter (Signed)
Pt has actually viewed through mychart & unless she has questions I do not need to speak with her. She needs labs scheduled  weeks out & they are ordered. Office may scheduled.

## 2020-12-12 NOTE — Telephone Encounter (Signed)
LMTCB for labs. 

## 2020-12-12 NOTE — Telephone Encounter (Signed)
Pt returning call regarding labs

## 2020-12-16 ENCOUNTER — Telehealth: Payer: Self-pay | Admitting: Adult Health

## 2020-12-16 NOTE — Telephone Encounter (Signed)
Patient's vitamin D did not reach the pharmacy. Please resend

## 2020-12-18 ENCOUNTER — Other Ambulatory Visit: Payer: Self-pay

## 2020-12-18 ENCOUNTER — Ambulatory Visit
Admission: RE | Admit: 2020-12-18 | Discharge: 2020-12-18 | Disposition: A | Discharge status: Home / Self Care | Payer: Medicaid Other | Source: Ambulatory Visit | Attending: Adult Health | Admitting: Adult Health

## 2020-12-18 DIAGNOSIS — Z3042 Encounter for surveillance of injectable contraceptive: Secondary | ICD-10-CM | POA: Diagnosis not present

## 2020-12-18 DIAGNOSIS — Z1231 Encounter for screening mammogram for malignant neoplasm of breast: Secondary | ICD-10-CM

## 2020-12-18 NOTE — Progress Notes (Signed)
Screening mammogram within normal, repeat in one year unless any new symptoms return for evaluation.   RECOMMENDATION: Screening mammogram in one year. (Code:[id number]-[id number]) BI-RADS CATEGORY  1: Negative.   Electronically Signed   By: Margarette Canada M.D.   On: 12/18/2020 11:47

## 2020-12-19 ENCOUNTER — Telehealth: Payer: Self-pay

## 2020-12-19 NOTE — Telephone Encounter (Signed)
Placed call to pt to make sure we had correct pharmacy in her chart. LMTCB

## 2020-12-19 NOTE — Telephone Encounter (Signed)
Opened in error

## 2020-12-27 ENCOUNTER — Other Ambulatory Visit: Payer: Self-pay

## 2020-12-27 DIAGNOSIS — E559 Vitamin D deficiency, unspecified: Secondary | ICD-10-CM

## 2021-01-01 ENCOUNTER — Other Ambulatory Visit: Payer: Self-pay

## 2021-01-01 DIAGNOSIS — E559 Vitamin D deficiency, unspecified: Secondary | ICD-10-CM

## 2021-01-01 NOTE — Telephone Encounter (Signed)
Placed call to pt to confirm pharmacy. Unable to LVM

## 2021-01-05 DIAGNOSIS — Z419 Encounter for procedure for purposes other than remedying health state, unspecified: Secondary | ICD-10-CM | POA: Diagnosis not present

## 2021-01-07 ENCOUNTER — Other Ambulatory Visit: Payer: Self-pay | Admitting: Adult Health

## 2021-01-07 MED ORDER — ROSUVASTATIN CALCIUM 5 MG PO TABS: 5.0000 mg | ORAL_TABLET | Freq: Every day | ORAL | 3 refills | Status: AC

## 2021-02-05 DIAGNOSIS — Z419 Encounter for procedure for purposes other than remedying health state, unspecified: Secondary | ICD-10-CM | POA: Diagnosis not present

## 2021-03-05 DIAGNOSIS — Z419 Encounter for procedure for purposes other than remedying health state, unspecified: Secondary | ICD-10-CM | POA: Diagnosis not present

## 2021-03-11 DIAGNOSIS — Z3042 Encounter for surveillance of injectable contraceptive: Secondary | ICD-10-CM | POA: Diagnosis not present

## 2021-03-12 ENCOUNTER — Ambulatory Visit (INDEPENDENT_AMBULATORY_CARE_PROVIDER_SITE_OTHER): Payer: Self-pay | Admitting: Adult Health

## 2021-03-12 DIAGNOSIS — Z91199 Patient's noncompliance with other medical treatment and regimen due to unspecified reason: Secondary | ICD-10-CM

## 2021-03-12 NOTE — Progress Notes (Signed)
NO SHOW for appointment that was scheduled on 03/12/21 at 8:30 am.  ?

## 2021-03-22 ENCOUNTER — Encounter: Payer: Self-pay | Admitting: Radiology

## 2021-04-05 DIAGNOSIS — Z419 Encounter for procedure for purposes other than remedying health state, unspecified: Secondary | ICD-10-CM | POA: Diagnosis not present

## 2021-05-05 DIAGNOSIS — Z419 Encounter for procedure for purposes other than remedying health state, unspecified: Secondary | ICD-10-CM | POA: Diagnosis not present

## 2021-05-20 DIAGNOSIS — Z3042 Encounter for surveillance of injectable contraceptive: Secondary | ICD-10-CM | POA: Diagnosis not present

## 2021-05-22 ENCOUNTER — Ambulatory Visit (INDEPENDENT_AMBULATORY_CARE_PROVIDER_SITE_OTHER): Payer: Medicaid Other | Admitting: Advanced Practice Midwife

## 2021-05-22 ENCOUNTER — Encounter: Payer: Self-pay | Admitting: Advanced Practice Midwife

## 2021-05-22 VITALS — BP 135/81 | HR 96 | Wt 147.0 lb

## 2021-05-22 DIAGNOSIS — N898 Other specified noninflammatory disorders of vagina: Secondary | ICD-10-CM

## 2021-05-22 NOTE — Progress Notes (Signed)
Growth she had for 10 years, would like to get removed. It getting bigger and more painful

## 2021-05-22 NOTE — Progress Notes (Signed)
GYNECOLOGY PROBLEM CARE ENCOUNTER NOTE  History:     Ashley Massey is a 46 y.o. G30P1011 female here for evaluation of a "bump" on her perineum. Patient states she has had it for about ten years but feels that it is growing. She previously had an identical lesion, discovered by her doctor during her daughter's birth in 2014. She states the doctor "cut it off" but it eventually grew back in the same place. She is now experiencing discomfort during peri care due to the size and location of the lesion. Denies abnormal vaginal bleeding, discharge, pelvic pain, problems with intercourse or other gynecologic concerns.    Gynecologic History No LMP recorded. Patient has had an injection. Contraception: Depo-Provera injections Last Pap: 05/15/2020. Result was normal with negative HPV Last Mammogram: 12/18/2020.  Result was normal   Obstetric History OB History  Gravida Para Term Preterm AB Living  '2 1 1   1 1  '$ SAB IAB Ectopic Multiple Live Births    1     1    # Outcome Date GA Lbr Len/2nd Weight Sex Delivery Anes PTL Lv  2 IAB 2020          1 Term 01/25/12    F Vag-Spont EPI  LIV    Past Medical History:  Diagnosis Date   Active labor 01/25/2012   GBS (group B Streptococcus carrier), +RV culture, currently pregnant 01/25/2012   Hypertension     Past Surgical History:  Procedure Laterality Date   UTERINE FIBROID SURGERY      Current Outpatient Medications on File Prior to Visit  Medication Sig Dispense Refill   hydrochlorothiazide (HYDRODIURIL) 25 MG tablet Take 0.5 tablets (12.5 mg total) by mouth daily. 90 tablet 0   hydrOXYzine (ATARAX/VISTARIL) 50 MG tablet Take 1 tablet by mouth every 8 hours as needed for anxiety. May cause drowsiness, no driving or legal decisions with medication. 60 tablet 1   lisinopril (ZESTRIL) 10 MG tablet Take 1 tablet (10 mg total) by mouth daily. 90 tablet 3   medroxyPROGESTERone (DEPO-PROVERA) 150 MG/ML injection Inject 150 mg into the muscle every  3 (three) months.     rosuvastatin (CRESTOR) 5 MG tablet Take 1 tablet (5 mg total) by mouth daily. 90 tablet 3   Vitamin D, Ergocalciferol, (DRISDOL) 1.25 MG (50000 UNIT) CAPS capsule Take 1 capsule (50,000 Units total) by mouth every 7 (seven) days. (taking one tablet per week) scheduled Vitamin D lab in  1-2 weeks after completing prescription. 12 capsule 0   escitalopram (LEXAPRO) 5 MG tablet Take 3 tablets (15 mg total) by mouth daily. 90 tablet 2   No current facility-administered medications on file prior to visit.    No Known Allergies  Social History:  reports that she has never smoked. She has never used smokeless tobacco. She reports current alcohol use of about 12.0 standard drinks per week. She reports that she does not use drugs.  Family History  Problem Relation Age of Onset   Hypertension Mother    Hyperlipidemia Father    Hypertension Father    Healthy Sister    Hypertension Maternal Grandmother    Hypertension Maternal Grandfather    Heart disease Paternal Grandmother    Hypertension Paternal Grandmother    Hypertension Paternal Grandfather    Breast cancer Neg Hx     The following portions of the patient's history were reviewed and updated as appropriate: allergies, current medications, past family history, past medical history, past social history,  past surgical history and problem list.  Review of Systems Pertinent items noted in HPI and remainder of comprehensive ROS otherwise negative.  Physical Exam:  BP 135/81   Pulse 96   Wt 147 lb (66.7 kg)   BMI 27.10 kg/m  CONSTITUTIONAL: Well-developed, well-nourished female in no acute distress.  HENT:  Normocephalic, atraumatic, External right and left ear normal.  EYES: Conjunctivae and EOM are normal. Pupils are equal, round, and reactive to light. No scleral icterus.  ABDOMEN: Soft, no distention noted.  No tenderness, rebound or guarding.  PELVIC: Normal appearing external genitalia and urethral meatus;  Visible vaguely circular collection of tissue at inferior aspect of vaginal introitus. Tissue is hard, non-mobile, slightly pedunculated. Performed in the presence of a chaperone.   Assessment and Plan:    1. Vaginal lesion - Discussed with patient that I'm not comfortable removing but will refer her to one of my MD practice partners - Can also refer to Derm as office next door may have openings before OB MD at [facility] referral to Dermatology   Routine preventative health maintenance measures emphasized. Please refer to After Visit Summary for other counseling recommendations.     Mallie Snooks, [place], MSN, CNM Certified Nurse Midwife, Product/process development scientist for [employer], [place]

## 2021-05-23 ENCOUNTER — Ambulatory Visit (INDEPENDENT_AMBULATORY_CARE_PROVIDER_SITE_OTHER): Payer: Medicaid Other | Admitting: Family

## 2021-05-23 ENCOUNTER — Encounter: Payer: Self-pay | Admitting: Family

## 2021-05-23 VITALS — BP 132/84 | HR 72 | Temp 98.5°F | Resp 16 | Ht 61.75 in | Wt 147.5 lb

## 2021-05-23 DIAGNOSIS — I10 Essential (primary) hypertension: Secondary | ICD-10-CM

## 2021-05-23 DIAGNOSIS — R252 Cramp and spasm: Secondary | ICD-10-CM | POA: Diagnosis not present

## 2021-05-23 DIAGNOSIS — E559 Vitamin D deficiency, unspecified: Secondary | ICD-10-CM | POA: Diagnosis not present

## 2021-05-23 DIAGNOSIS — F419 Anxiety disorder, unspecified: Secondary | ICD-10-CM

## 2021-05-23 DIAGNOSIS — E785 Hyperlipidemia, unspecified: Secondary | ICD-10-CM

## 2021-05-23 LAB — CK: Total CK: 122 U/L (ref 7–177)

## 2021-05-23 LAB — VITAMIN D 25 HYDROXY (VIT D DEFICIENCY, FRACTURES): VITD: 70.22 ng/mL (ref 30.00–100.00)

## 2021-05-23 LAB — COMPREHENSIVE METABOLIC PANEL
ALT: 57 U/L — ABNORMAL HIGH (ref 0–35)
AST: 55 U/L — ABNORMAL HIGH (ref 0–37)
Albumin: 4.6 g/dL (ref 3.5–5.2)
Alkaline Phosphatase: 51 U/L (ref 39–117)
BUN: 6 mg/dL (ref 6–23)
CO2: 26 mEq/L (ref 19–32)
Calcium: 9.7 mg/dL (ref 8.4–10.5)
Chloride: 102 mEq/L (ref 96–112)
Creatinine, Ser: 0.68 mg/dL (ref 0.40–1.20)
GFR: 106.67 mL/min (ref >60.00)
Glucose, Bld: 83 mg/dL (ref 70–99)
Potassium: 4.2 mEq/L (ref 3.5–5.1)
Sodium: 140 mEq/L (ref 135–145)
Total Bilirubin: 0.3 mg/dL (ref 0.2–1.2)
Total Protein: 7.2 g/dL (ref 6.0–8.3)

## 2021-05-23 LAB — CBC
HCT: 40.6 % (ref 36.0–46.0)
Hemoglobin: 13.6 g/dL (ref 12.0–15.0)
MCHC: 33.4 g/dL (ref 30.0–36.0)
MCV: 94.5 fl (ref 78.0–100.0)
Platelets: 282 10*3/uL (ref 150.0–400.0)
RBC: 4.3 Mil/uL (ref 3.87–5.11)
RDW: 12.9 % (ref 11.5–15.5)
WBC: 5.3 10*3/uL (ref 4.0–10.5)

## 2021-05-23 LAB — MAGNESIUM: Magnesium: 1.3 mg/dL — ABNORMAL LOW (ref 1.5–2.5)

## 2021-05-23 LAB — MICROALBUMIN / CREATININE URINE RATIO
Creatinine,U: 41.3 mg/dL
Microalb Creat Ratio: 1.7 mg/g (ref 0.0–30.0)
Microalb, Ur: <0.7 mg/dL (ref 0.0–1.9)

## 2021-05-23 MED ORDER — LISINOPRIL 20 MG PO TABS: 20.0000 mg | ORAL_TABLET | Freq: Every day | ORAL | 3 refills | Status: AC

## 2021-05-23 NOTE — Patient Instructions (Signed)
Recommend magnesium malleate over the counter for leg cramps. About 300-400 mg daily.

## 2021-05-23 NOTE — Assessment & Plan Note (Signed)
Suspected dehydration at time, work on water intake.  Order ck and magnesium Recommend daily magnesium malleate 300-400 mg

## 2021-05-23 NOTE — Assessment & Plan Note (Signed)
Ordered vitamin d pending results.  Continue vitamin D3 2000 IU once daily

## 2021-05-23 NOTE — Assessment & Plan Note (Signed)
Continue crestor 5 mg  Ordered lipid panel, pending results. Work on low cholesterol diet and exercise as tolerated  

## 2021-05-23 NOTE — Assessment & Plan Note (Signed)
Increase lisinopril to 20 mg once daily , continue hctz 25 mcg once daily. Increase water intake.  Pt advised of the following:  Continue medication as prescribed. Monitor blood pressure periodically and/or when you feel symptomatic. Goal is <130/90 on average. Ensure that you have rested for 30 minutes prior to checking your blood pressure. Record your readings and bring them to your next visit if necessary.work on a low sodium diet.

## 2021-05-23 NOTE — Progress Notes (Signed)
Established Patient Office Visit  Subjective:  Patient ID: Ashley Massey, female    DOB: April 09, 1975  Age: 46 y.o. MRN: [id number]  CC:  Chief Complaint  Patient presents with   Transitions Of Care    HPI Ashley Massey is here for a transition of care visit.  Prior provider was: Laverna Peace, FNP Pt is without acute concerns.   Pap: 05/15/20 pap negative  Mammo: 12/18/20, negative   chronic concerns:  HLD: crestor 5 mg , had leg cramps so started taking otc potassium,  Lab Results  Component Value Date   CHOL 209 (H) 12/11/2020   HDL 76.20 12/11/2020   LDLCALC 97 12/11/2020   TRIG 176.0 (H) 12/11/2020   CHOLHDL 3 12/11/2020   Depo provera, on birth control. [facility]   HTN: on HCTZ 25 mg and also lisinopril 10 mg once daily. Doesn't check blood pressure at home. Was donating plasma at some time, and average was 130-140 on top, bottom number 'good'. No headache, no cp palp and or sob.   Anxiety: hydroxyzine 50 mg, not sure if this really helps. Has also tried Wellbutrin in the past as well as buspirone.   Vitamin d def: took a loading dose back in December, taking otc d3 2000 Iu once daily.   Past Medical History:  Diagnosis Date   Active labor 01/25/2012   GBS (group B Streptococcus carrier), +RV culture, currently pregnant 01/25/2012   Hypertension     Past Surgical History:  Procedure Laterality Date   UTERINE FIBROID SURGERY      Family History  Problem Relation Age of Onset   Hypertension Mother    Hyperlipidemia Father    Hypertension Father    Healthy Sister    Hypertension Maternal Grandmother    Hypertension Maternal Grandfather    Heart disease Paternal Grandmother    Hypertension Paternal Grandmother    Hypertension Paternal Grandfather    Breast cancer Neg Hx     Social History   Socioeconomic History   Marital status: Significant Other    Spouse name: Not on file   Number of children: 1   Years of  education: Not on file   Highest education level: Not on file  Occupational History    Employer: OTHER   Occupation: umeployed  Tobacco Use   Smoking status: Never   Smokeless tobacco: Never  Vaping Use   Vaping Use: Former  Substance and Sexual Activity   Alcohol use: Not Currently    Comment: daily (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) white claw   Drug use: Never   Sexual activity: Yes    Partners: Male    Birth control/protection: Injection    Comment: lives with boyfriend  Other Topics Concern   Not on file  Social History Narrative   Girl, 46 y/o 2023   Social Determinants of Health   Financial Resource Strain: Not on file  Food Insecurity: Not on file  Transportation Needs: Not on file  Physical Activity: Not on file  Stress: Not on file  Social Connections: Not on file  Intimate Partner Violence: Not on file    Outpatient Medications Prior to Visit  Medication Sig Dispense Refill   hydrochlorothiazide (HYDRODIURIL) 25 MG tablet Take 0.5 tablets (12.5 mg total) by mouth daily. 90 tablet 0   hydrOXYzine (ATARAX/VISTARIL) 50 MG tablet Take 1 tablet by mouth every 8 hours as needed for anxiety. May cause drowsiness, no driving or legal decisions with medication. 60 tablet 1  medroxyPROGESTERone (DEPO-PROVERA) 150 MG/ML injection Inject 150 mg into the muscle every 3 (three) months.     rosuvastatin (CRESTOR) 5 MG tablet Take 1 tablet (5 mg total) by mouth daily. 90 tablet 3   escitalopram (LEXAPRO) 5 MG tablet Take 3 tablets (15 mg total) by mouth daily. 90 tablet 2   lisinopril (ZESTRIL) 10 MG tablet Take 1 tablet (10 mg total) by mouth daily. 90 tablet 3   Vitamin D, Ergocalciferol, (DRISDOL) 1.25 MG (50000 UNIT) CAPS capsule Take 1 capsule (50,000 Units total) by mouth every 7 (seven) days. (taking one tablet per week) scheduled Vitamin D lab in  1-2 weeks after completing prescription. 12 capsule 0   No facility-administered medications prior to visit.    No Known Allergies  ROS Review of  Systems  Review of Systems  Respiratory:  Negative for shortness of breath.   Cardiovascular:  Negative for chest pain and palpitations.  Gastrointestinal:  Negative for constipation and diarrhea.  Genitourinary:  Negative for dysuria, frequency and urgency.  Musculoskeletal:  Negative for myalgias.  Psychiatric/Behavioral:  Negative for depression and suicidal ideas.   All other systems reviewed and are negative.    Objective:    Physical Exam  Gen: NAD, resting comfortably CV: RRR with no murmurs appreciated Pulm: NWOB, CTAB with no crackles, wheezes, or rhonchi Skin: warm, dry Psych: Normal affect and thought content  BP 132/84   Pulse 72   Temp 98.5 F (36.9 C)   Resp 16   Ht 5' 1.75" (1.568 m)   Wt 147 lb 8 oz (66.9 kg)   SpO2 98%   BMI 27.20 kg/m  Wt Readings from Last 3 Encounters:  05/23/21 147 lb 8 oz (66.9 kg)  05/22/21 147 lb (66.7 kg)  12/11/20 146 lb (66.2 kg)     There are no preventive care reminders to display for this patient.   There are no preventive care reminders to display for this patient.  Lab Results  Component Value Date   TSH 1.63 12/11/2020   Lab Results  Component Value Date   WBC 7.4 12/11/2020   HGB 14.5 12/11/2020   HCT 43.2 12/11/2020   MCV 92.9 12/11/2020   PLT 281.0 12/11/2020   Lab Results  Component Value Date   NA 136 12/11/2020   K 3.8 12/11/2020   CO2 26 12/11/2020   GLUCOSE 97 12/11/2020   BUN 10 12/11/2020   CREATININE 0.61 12/11/2020   BILITOT 0.5 12/11/2020   ALKPHOS 37 (L) 12/11/2020   AST 17 12/11/2020   ALT 15 12/11/2020   PROT 6.3 12/11/2020   ALBUMIN 4.0 12/11/2020   CALCIUM 9.8 12/11/2020   ANIONGAP 16 (H) 07/03/2019   EGFR 100 04/19/2020   GFR 109.84 12/11/2020   Lab Results  Component Value Date   CHOL 209 (H) 12/11/2020   Lab Results  Component Value Date   HDL 76.20 12/11/2020   Lab Results  Component Value Date   LDLCALC 97 12/11/2020   Lab Results  Component Value Date    TRIG 176.0 (H) 12/11/2020   Lab Results  Component Value Date   CHOLHDL 3 12/11/2020   No results found for: HGBA1C    Assessment & Plan:   Problem List Items Addressed This Visit       Cardiovascular and Mediastinum   Hypertension - Primary    Increase lisinopril to 20 mg once daily , continue hctz 25 mcg once daily. Increase water intake.  Pt advised of the following:  Continue medication as prescribed. Monitor blood pressure periodically and/or when you feel symptomatic. Goal is <130/90 on average. Ensure that you have rested for 30 minutes prior to checking your blood pressure. Record your readings and bring them to your next visit if necessary.work on a low sodium diet.       Relevant Medications   lisinopril (ZESTRIL) 20 MG tablet   Other Relevant Orders   Comprehensive metabolic panel   CBC   Microalbumin / creatinine urine ratio     Other   Hyperlipidemia    Continue crestor 5 mg  Ordered lipid panel, pending results. Work on low cholesterol diet and exercise as tolerated        Relevant Medications   lisinopril (ZESTRIL) 20 MG tablet   Anxiety    continue hydroxyxine 50 mg as needed Work on anxiety reducing techniques D/w pt other options, pt declines for now for medication Recommend counseling, therapy . Pt given handout with phone number to call if desired.       Muscle cramps    Suspected dehydration at time, work on water intake.  Order ck and magnesium Recommend daily magnesium malleate 300-400 mg        Relevant Orders   Magnesium   CK   CBC   Vitamin D insufficiency    Ordered vitamin d pending results.  Continue vitamin D3 2000 IU once daily       Relevant Orders   VITAMIN D 25 Hydroxy (Vit-D Deficiency, Fractures)    Meds ordered this encounter  Medications   lisinopril (ZESTRIL) 20 MG tablet    Sig: Take 1 tablet (20 mg total) by mouth daily.    Dispense:  90 tablet    Refill:  3    Order Specific Question:   Supervising  Provider    Answer:   Diona Browner, AMY E [[id number]]    Follow-up: Return in about 6 months (around 11/23/2021) for regular follow up , come fasting .    Eugenia Pancoast, FNP

## 2021-05-23 NOTE — Assessment & Plan Note (Signed)
continue hydroxyxine 50 mg as needed Work on anxiety reducing techniques D/w pt other options, pt declines for now for medication Recommend counseling, therapy . Pt given handout with phone number to call if desired.

## 2021-05-26 ENCOUNTER — Other Ambulatory Visit: Payer: Self-pay | Admitting: Family

## 2021-05-26 DIAGNOSIS — R7989 Other specified abnormal findings of blood chemistry: Secondary | ICD-10-CM

## 2021-05-26 NOTE — Progress Notes (Signed)
Can we add hepatitis panel? Added as future order.

## 2021-05-26 NOTE — Progress Notes (Signed)
Magnesium low, this can definitely contribute to your muscle cramps. I do recommend daily magnesium malleate 400 mg that we spoke about during our visit.  Then let's follow up one month and repeat lab work to see if improvement.   Liver function is a bit elevated.  Any recent Etoh, ibuprofen, tylenol, herbal supplements? Any ruq abd pain?   Vitamin d is good level.  No anemia. Negative urine microalbumin.

## 2021-06-05 DIAGNOSIS — Z419 Encounter for procedure for purposes other than remedying health state, unspecified: Secondary | ICD-10-CM | POA: Diagnosis not present

## 2021-06-17 ENCOUNTER — Encounter: Payer: Self-pay | Admitting: Obstetrics and Gynecology

## 2021-06-17 ENCOUNTER — Other Ambulatory Visit: Payer: Self-pay | Admitting: Obstetrics and Gynecology

## 2021-06-17 ENCOUNTER — Ambulatory Visit (INDEPENDENT_AMBULATORY_CARE_PROVIDER_SITE_OTHER): Payer: Medicaid Other | Admitting: Obstetrics and Gynecology

## 2021-06-17 VITALS — BP 133/81 | HR 77 | Wt 147.0 lb

## 2021-06-17 DIAGNOSIS — A630 Anogenital (venereal) warts: Secondary | ICD-10-CM | POA: Diagnosis not present

## 2021-06-18 ENCOUNTER — Encounter: Payer: Self-pay | Admitting: Obstetrics and Gynecology

## 2021-06-18 HISTORY — PX: VAGINAL MASS EXCISION: SHX2640

## 2021-06-18 NOTE — Progress Notes (Signed)
See procedure note.

## 2021-06-18 NOTE — Procedures (Signed)
Vaginal Mass Excision Procedure Note  Pre-operative Diagnosis: Genital wart  Post-operative Diagnosis: same  Indications: Patient states when she had her last baby the doctor removed it, but it has come back over that time and feels bigger than it did when she had it removed. It is uncomfortable due to it's location, no discharge but it does have some bleeding due to it's location  Procedure Details  The risks (including infection, bleeding, pain, and uterine perforation) and benefits of the procedure were explained to the patient and Written informed consent was obtained.  The patient was placed in the dorsal lithotomy position. Normal EGBUS and there was a pedunculated 51m at it's top width, non friable and looks consistent with a genital wart right at the posterior introitus at 6 o'clock. She states that she had it in the very remote past as a teen.   The area was swabbed with alcohol and then injected with 313mof 1% of lidocaine with epi. It was then swabbed with betadine and using a 15 blade, the lesion was removed just below it's base. The sample was sent for pathologic examination.  Using interrupted 4-0 vicryl, the area was closed.   Condition: Stable  Complications: None  Plan: Recommend no sex for a month but okay for sitz baths, tub baths  ChDurene RomansD Attending [facility]

## 2021-06-25 ENCOUNTER — Encounter: Payer: Self-pay | Admitting: Obstetrics and Gynecology

## 2021-07-04 ENCOUNTER — Other Ambulatory Visit: Payer: Self-pay | Admitting: Family

## 2021-07-04 ENCOUNTER — Ambulatory Visit (INDEPENDENT_AMBULATORY_CARE_PROVIDER_SITE_OTHER): Payer: Medicaid Other | Admitting: Family

## 2021-07-04 ENCOUNTER — Encounter: Payer: Self-pay | Admitting: Family

## 2021-07-04 VITALS — BP 130/84 | HR 66 | Temp 98.6°F | Resp 16 | Ht 61.75 in | Wt 147.2 lb

## 2021-07-04 DIAGNOSIS — R7989 Other specified abnormal findings of blood chemistry: Secondary | ICD-10-CM | POA: Diagnosis not present

## 2021-07-04 DIAGNOSIS — E785 Hyperlipidemia, unspecified: Secondary | ICD-10-CM | POA: Diagnosis not present

## 2021-07-04 DIAGNOSIS — R252 Cramp and spasm: Secondary | ICD-10-CM

## 2021-07-04 DIAGNOSIS — I10 Essential (primary) hypertension: Secondary | ICD-10-CM

## 2021-07-04 DIAGNOSIS — F419 Anxiety disorder, unspecified: Secondary | ICD-10-CM

## 2021-07-04 DIAGNOSIS — E559 Vitamin D deficiency, unspecified: Secondary | ICD-10-CM | POA: Diagnosis not present

## 2021-07-04 LAB — HEPATIC FUNCTION PANEL
ALT: 47 U/L — ABNORMAL HIGH (ref 0–35)
AST: 36 U/L (ref 0–37)
Albumin: 4.9 g/dL (ref 3.5–5.2)
Alkaline Phosphatase: 53 U/L (ref 39–117)
Bilirubin, Direct: 0.1 mg/dL (ref 0.0–0.3)
Total Bilirubin: 0.6 mg/dL (ref 0.2–1.2)
Total Protein: 7.8 g/dL (ref 6.0–8.3)

## 2021-07-04 MED ORDER — HYDROXYZINE HCL 50 MG PO TABS: ORAL_TABLET | ORAL | 1 refills | Status: DC

## 2021-07-04 NOTE — Assessment & Plan Note (Signed)
Work on low chol diet. 

## 2021-07-04 NOTE — Assessment & Plan Note (Signed)
Repeat lft today and hepatitis panel pending results

## 2021-07-04 NOTE — Assessment & Plan Note (Signed)
Change to lower dose mag as causing diarrhea

## 2021-07-04 NOTE — Patient Instructions (Addendum)
Try magnesium malate on [employer], 400 mg , brand name MAL.   Due to recent changes in healthcare laws, you may see results of your imaging and/or laboratory studies on MyChart before I have had a chance to review them.  I understand that in some cases there may be results that are confusing or concerning to you. Please understand that not all results are received at the same time and often I may need to interpret multiple results in order to provide you with the best plan of care or course of treatment. Therefore, I ask that you please give me 2 business days to thoroughly review all your results before contacting my office for clarification. Should we see a critical lab result, you will be contacted sooner.   It was a pleasure seeing you today! Please do not hesitate to reach out with any questions and or concerns.  Regards,   Eugenia Pancoast FNP-C

## 2021-07-04 NOTE — Assessment & Plan Note (Signed)
Continue daily vitamin D

## 2021-07-04 NOTE — Progress Notes (Signed)
Established Patient Office Visit  Subjective:  Patient ID: Ashley Massey, female    DOB: [birth year]  Age: 46 y.o. MRN: [id number]  CC:  Chief Complaint  Patient presents with   Hypertension    HPI Cait CARMELLA KEES is here today for follow up.   Pt is with acute concerns.  HTN: has not been checking blood pressure at home. Increased lisinopril to 20 mg once daily as well as still on HCTZ.   Anxiety: needs refil on hydroxyxine 50 mg prn . Finds it helpful at times, taking it once daily.   Elevated lfts: had had alcohol a few days prior. No abdominal pain. No recent tylenol and ibuprofen. No recent etoh.   Magnesium: low, taking daily magnesium.   Past Medical History:  Diagnosis Date   GBS (group B Streptococcus carrier), +RV culture, currently pregnant 01/25/2012   Genital warts    Hypertension     Past Surgical History:  Procedure Laterality Date   UTERINE FIBROID SURGERY     VAGINAL MASS EXCISION  06/18/2021    Family History  Problem Relation Age of Onset   Hypertension Mother    Hyperlipidemia Father    Hypertension Father    Healthy Sister    Hypertension Maternal Grandmother    Hypertension Maternal Grandfather    Heart disease Paternal Grandmother    Hypertension Paternal Grandmother    Hypertension Paternal Grandfather    Breast cancer Neg Hx     Social History   Socioeconomic History   Marital status: Significant Other    Spouse name: Not on file   Number of children: 1   Years of education: Not on file   Highest education level: Not on file  Occupational History    Employer: OTHER   Occupation: umeployed  Tobacco Use   Smoking status: Never   Smokeless tobacco: Never  Vaping Use   Vaping Use: Former  Substance and Sexual Activity   Alcohol use: Not Currently    Comment: daily (1-2) white claw   Drug use: Never   Sexual activity: Yes    Partners: Male    Birth control/protection: Injection    Comment: lives with boyfriend  Other Topics  Concern   Not on file  Social History Narrative   Girl, 46 y/o 2023   Social Determinants of Health   Financial Resource Strain: Not on file  Food Insecurity: Not on file  Transportation Needs: Not on file  Physical Activity: Not on file  Stress: Not on file  Social Connections: Not on file  Intimate Partner Violence: Not on file    Outpatient Medications Prior to Visit  Medication Sig Dispense Refill   hydrochlorothiazide (HYDRODIURIL) 25 MG tablet Take 0.5 tablets (12.5 mg total) by mouth daily. 90 tablet 0   lisinopril (ZESTRIL) 20 MG tablet Take 1 tablet (20 mg total) by mouth daily. 90 tablet 3   medroxyPROGESTERone (DEPO-PROVERA) 150 MG/ML injection Inject 150 mg into the muscle every 3 (three) months.     rosuvastatin (CRESTOR) 5 MG tablet Take 1 tablet (5 mg total) by mouth daily. 90 tablet 3   hydrOXYzine (ATARAX/VISTARIL) 50 MG tablet Take 1 tablet by mouth every 8 hours as needed for anxiety. May cause drowsiness, no driving or legal decisions with medication. 60 tablet 1   No facility-administered medications prior to visit.    No Known Allergies       Objective:    Physical Exam  Gen: NAD, resting comfortably HEENT: TMs  normal bilaterally. OP clear. No thyromegaly noted.  CV: RRR with no murmurs appreciated Pulm: NWOB, CTAB with no crackles, wheezes, or rhonchi GI: Normal bowel sounds present. Soft, Nontender, Nondistended. MSK: no edema, cyanosis, or clubbing noted Skin: warm, dry Psych: Normal affect and thought content  BP 130/84   Pulse 66   Temp 98.6 F (37 C)   Resp 16   Ht 5' 1.75" (1.568 m)   Wt 147 lb 3 oz (66.8 kg)   SpO2 92%   BMI 27.14 kg/m  Wt Readings from Last 3 Encounters:  07/04/21 147 lb 3 oz (66.8 kg)  06/17/21 147 lb (66.7 kg)  05/23/21 147 lb 8 oz (66.9 kg)     Health Maintenance Due  Topic Date Due   COVID-19 Vaccine (1) Never done    There are no preventive care reminders to display for this patient.  Lab Results   Component Value Date   TSH 1.63 12/11/2020   Lab Results  Component Value Date   WBC 5.3 05/23/2021   HGB 13.6 05/23/2021   HCT 40.6 05/23/2021   MCV 94.5 05/23/2021   PLT 282.0 05/23/2021   Lab Results  Component Value Date   NA 140 05/23/2021   K 4.2 05/23/2021   CO2 26 05/23/2021   GLUCOSE 83 05/23/2021   BUN 6 05/23/2021   CREATININE 0.68 05/23/2021   BILITOT 0.3 05/23/2021   ALKPHOS 51 05/23/2021   AST 55 (H) 05/23/2021   ALT 57 (H) 05/23/2021   PROT 7.2 05/23/2021   ALBUMIN 4.6 05/23/2021   CALCIUM 9.7 05/23/2021   ANIONGAP 16 (H) 07/03/2019   EGFR 100 04/19/2020   GFR 106.67 05/23/2021   Lab Results  Component Value Date   CHOL 209 (H) 12/11/2020   Lab Results  Component Value Date   HDL 76.20 12/11/2020   Lab Results  Component Value Date   LDLCALC 97 12/11/2020   Lab Results  Component Value Date   TRIG 176.0 (H) 12/11/2020   Lab Results  Component Value Date   CHOLHDL 3 12/11/2020   No results found for: "HGBA1C"    Assessment & Plan:   Problem List Items Addressed This Visit       Cardiovascular and Mediastinum   Hypertension    Continue lisinopril 20 mg once daily. Pt advised of the following:  Continue medication as prescribed. Monitor blood pressure periodically and/or when you feel symptomatic. Goal is <130/90 on average. Ensure that you have rested for 30 minutes prior to checking your blood pressure. Record your readings and bring them to your next visit if necessary.work on a low sodium diet.         Other   Hyperlipidemia    Work on low chol diet      Anxiety    hydroxyxine refill sent.  D/w pt maybe considering daily ssri again, pt will consider.       Relevant Medications   hydrOXYzine (ATARAX) 50 MG tablet   Muscle cramps    Change to lower dose mag as causing diarrhea      Vitamin D insufficiency    Continue daily vitamin D      Elevated LFTs - Primary    Repeat lft today and hepatitis panel pending  results       Relevant Orders   Hepatitis panel, acute   Hepatic Function Panel    Meds ordered this encounter  Medications   hydrOXYzine (ATARAX) 50 MG tablet    Sig: Take  1 tablet by mouth every 8 hours as needed for anxiety. May cause drowsiness, no driving or legal decisions with medication.    Dispense:  60 tablet    Refill:  1    Follow-up: Return in about 6 months (around 01/03/2022) for for regular follow up appt, come fasting .    Eugenia Pancoast, FNP

## 2021-07-04 NOTE — Assessment & Plan Note (Signed)
hydroxyxine refill sent.  D/w pt maybe considering daily ssri again, pt will consider.

## 2021-07-04 NOTE — Progress Notes (Signed)
Liver function still mildly elevated Ordering US abdomen, sent to [facility] have pt call to schedule

## 2021-07-04 NOTE — Assessment & Plan Note (Signed)
Continue lisinopril 20 mg once daily. Pt advised of the following:  Continue medication as prescribed. Monitor blood pressure periodically and/or when you feel symptomatic. Goal is <130/90 on average. Ensure that you have rested for 30 minutes prior to checking your blood pressure. Record your readings and bring them to your next visit if necessary.work on a low sodium diet.

## 2021-07-05 DIAGNOSIS — Z419 Encounter for procedure for purposes other than remedying health state, unspecified: Secondary | ICD-10-CM | POA: Diagnosis not present

## 2021-07-07 LAB — HEPATITIS PANEL, ACUTE
Hep A IgM: NONREACTIVE
Hep B C IgM: NONREACTIVE
Hepatitis B Surface Ag: NONREACTIVE
Hepatitis C Ab: NONREACTIVE

## 2021-07-20 ENCOUNTER — Telehealth: Payer: Medicaid Other | Admitting: Family

## 2021-07-20 DIAGNOSIS — J209 Acute bronchitis, unspecified: Secondary | ICD-10-CM

## 2021-07-20 DIAGNOSIS — J208 Acute bronchitis due to other specified organisms: Secondary | ICD-10-CM

## 2021-07-20 DIAGNOSIS — B9689 Other specified bacterial agents as the cause of diseases classified elsewhere: Secondary | ICD-10-CM | POA: Diagnosis not present

## 2021-07-20 MED ORDER — BENZONATATE 100 MG PO CAPS: 100.0000 mg | ORAL_CAPSULE | Freq: Three times a day (TID) | ORAL | 0 refills | Status: AC | PRN

## 2021-07-20 MED ORDER — AZITHROMYCIN 250 MG PO TABS: ORAL_TABLET | ORAL | 0 refills | Status: AC

## 2021-07-20 MED ORDER — PREDNISONE 10 MG (21) PO TBPK: ORAL_TABLET | ORAL | 0 refills | Status: DC

## 2021-07-20 MED ORDER — ALBUTEROL SULFATE HFA 108 (90 BASE) MCG/ACT IN AERS: 2.0000 | INHALATION_SPRAY | Freq: Four times a day (QID) | RESPIRATORY_TRACT | 0 refills | Status: AC | PRN

## 2021-07-20 NOTE — Progress Notes (Signed)
Virtual Visit Consent   Ashley Massey, you are scheduled for a virtual visit with a [facility] provider today. Just as with appointments in the office, your consent must be obtained to participate. Your consent will be active for this visit and any virtual visit you may have with one of our providers in the next 365 days. If you have a MyChart account, a copy of this consent can be sent to you electronically.  As this is a virtual visit, video technology does not allow for your provider to perform a traditional examination. This may limit your provider's ability to fully assess your condition. If your provider identifies any concerns that need to be evaluated in person or the need to arrange testing (such as labs, EKG, etc.), we will make arrangements to do so. Although advances in technology are sophisticated, we cannot ensure that it will always work on either your end or our end. If the connection with a video visit is poor, the visit may have to be switched to a telephone visit. With either a video or telephone visit, we are not always able to ensure that we have a secure connection.  By engaging in this virtual visit, you consent to the provision of healthcare and authorize for your insurance to be billed (if applicable) for the services provided during this visit. Depending on your insurance coverage, you may receive a charge related to this service.  I need to obtain your verbal consent now. Are you willing to proceed with your visit today? Ashley Massey has provided verbal consent on 07/20/2021 for a virtual visit (video or telephone). Evelina Dun, FNP  Date: 07/20/2021 6:21 PM  Virtual Visit via Video Note   I, Evelina Dun, connected with  Ashley Massey  ([id number], Aug 24, 1975) on 07/20/21 at  6:15 PM EDT by a video-enabled telemedicine application and verified that I am speaking with the correct person using two identifiers.  Location: Patient: Virtual Visit Location Patient:  Home Provider: Virtual Visit Location Provider: Home Office   I discussed the limitations of evaluation and management by telemedicine and the availability of in person appointments. The patient expressed understanding and agreed to proceed.    History of Present Illness: Ashley Massey is a 46 y.o. who identifies as a female who was assigned female at birth, and is being seen today for cough. She did an evisit, and was given prednisone. States she can not tolerate prednisone.   HPI: Cough This is a new problem. The current episode started 1 to 4 weeks ago. The problem has been gradually worsening. The problem occurs every few minutes. The cough is Productive of sputum. Associated symptoms include chills ("now and then"), ear congestion, ear pain, headaches and wheezing. Pertinent negatives include no fever, myalgias, nasal congestion, postnasal drip, sore throat or shortness of breath. She has tried rest and OTC cough suppressant for the symptoms. The treatment provided mild relief.    Problems:  Patient Active Problem List   Diagnosis Date Noted   Elevated LFTs 07/04/2021   Muscle cramps 05/23/2021   Vitamin D insufficiency 05/23/2021   Body mass index 26.0-26.9, adult 12/11/2020   Hyperlipidemia 09/26/2019   Hypertension 09/26/2019   Anxiety 09/26/2019    Allergies: No Known Allergies Medications:  Current Outpatient Medications:    albuterol (VENTOLIN HFA) 108 (90 Base) MCG/ACT inhaler, Inhale 2 puffs into the lungs every 6 (six) hours as needed for wheezing or shortness of breath., Disp: 8 g, Rfl: 0  azithromycin (ZITHROMAX) 250 MG tablet, Take 500 mg once, then 250 mg for four days, Disp: 6 tablet, Rfl: 0   benzonatate (TESSALON PERLES) 100 MG capsule, Take 1 capsule (100 mg total) by mouth 3 (three) times daily as needed., Disp: 20 capsule, Rfl: 0   hydrochlorothiazide (HYDRODIURIL) 25 MG tablet, Take 0.5 tablets (12.5 mg total) by mouth daily., Disp: 90 tablet, Rfl: 0    hydrOXYzine (ATARAX) 50 MG tablet, Take 1 tablet by mouth every 8 hours as needed for anxiety. May cause drowsiness, no driving or legal decisions with medication., Disp: 60 tablet, Rfl: 1   lisinopril (ZESTRIL) 20 MG tablet, Take 1 tablet (20 mg total) by mouth daily., Disp: 90 tablet, Rfl: 3   medroxyPROGESTERone (DEPO-PROVERA) 150 MG/ML injection, Inject 150 mg into the muscle every 3 (three) months., Disp: , Rfl:    rosuvastatin (CRESTOR) 5 MG tablet, Take 1 tablet (5 mg total) by mouth daily., Disp: 90 tablet, Rfl: 3  Observations/Objective: Patient is well-developed, well-nourished in no acute distress.  Resting comfortably  at home.  Head is normocephalic, atraumatic.  No labored breathing.  Speech is clear and coherent with logical content.  Patient is alert and oriented at baseline.  Hoarse voice, coarse cough, slight wheezing  Assessment and Plan: 1. Acute bacterial bronchitis - azithromycin (ZITHROMAX) 250 MG tablet; Take 500 mg once, then 250 mg for four days  Dispense: 6 tablet; Refill: 0 - albuterol (VENTOLIN HFA) 108 (90 Base) MCG/ACT inhaler; Inhale 2 puffs into the lungs every 6 (six) hours as needed for wheezing or shortness of breath.  Dispense: 8 g; Refill: 0  Will d/c prednisone rx since patient states she can not tolerate this.  - Take meds as prescribed - Use a cool mist humidifier  -Use saline nose sprays frequently -Force fluids -For any cough or congestion  Use plain Mucinex- regular strength or max strength is fine -For fever or aces or pains- take tylenol or ibuprofen. -Throat lozenges if help - Follow up if symptoms worsen or do not improve   Follow Up Instructions: I discussed the assessment and treatment plan with the patient. The patient was provided an opportunity to ask questions and all were answered. The patient agreed with the plan and demonstrated an understanding of the instructions.  A copy of instructions were sent to the patient via MyChart  unless otherwise noted below.     The patient was advised to call back or seek an in-person evaluation if the symptoms worsen or if the condition fails to improve as anticipated.  Time:  I spent 9 minutes with the patient via telehealth technology discussing the above problems/concerns.    Evelina Dun, FNP

## 2021-07-20 NOTE — Progress Notes (Signed)

## 2021-07-22 ENCOUNTER — Other Ambulatory Visit: Payer: Self-pay

## 2021-07-29 DIAGNOSIS — Z3042 Encounter for surveillance of injectable contraceptive: Secondary | ICD-10-CM | POA: Diagnosis not present

## 2021-08-05 DIAGNOSIS — Z419 Encounter for procedure for purposes other than remedying health state, unspecified: Secondary | ICD-10-CM | POA: Diagnosis not present

## 2021-08-06 ENCOUNTER — Telehealth: Payer: Medicaid Other | Admitting: Family Medicine

## 2021-08-06 DIAGNOSIS — N3000 Acute cystitis without hematuria: Secondary | ICD-10-CM

## 2021-08-06 MED ORDER — NITROFURANTOIN MONOHYD MACRO 100 MG PO CAPS: 100.0000 mg | ORAL_CAPSULE | Freq: Two times a day (BID) | ORAL | 0 refills | Status: AC

## 2021-08-06 NOTE — Progress Notes (Signed)
Virtual Visit Consent   Ashley Massey, you are scheduled for a virtual visit with a [facility] provider today. Just as with appointments in the office, your consent must be obtained to participate. Your consent will be active for this visit and any virtual visit you may have with one of our providers in the next 365 days. If you have a MyChart account, a copy of this consent can be sent to you electronically.  As this is a virtual visit, video technology does not allow for your provider to perform a traditional examination. This may limit your provider's ability to fully assess your condition. If your provider identifies any concerns that need to be evaluated in person or the need to arrange testing (such as labs, EKG, etc.), we will make arrangements to do so. Although advances in technology are sophisticated, we cannot ensure that it will always work on either your end or our end. If the connection with a video visit is poor, the visit may have to be switched to a telephone visit. With either a video or telephone visit, we are not always able to ensure that we have a secure connection.  By engaging in this virtual visit, you consent to the provision of healthcare and authorize for your insurance to be billed (if applicable) for the services provided during this visit. Depending on your insurance coverage, you may receive a charge related to this service.  I need to obtain your verbal consent now. Are you willing to proceed with your visit today? Ashley Massey has provided verbal consent on 08/06/2021 for a virtual visit (video or telephone). Dellia Nims, FNP  Date: 08/06/2021 8:42 AM  Virtual Visit via Video Note   I, Dellia Nims, connected with  Ashley Massey  ([id number], 1975/09/22) on 08/06/21 at  8:45 AM EDT by a video-enabled telemedicine application and verified that I am speaking with the correct person using two identifiers.  Location: Patient: Virtual Visit Location Patient:  Home Provider: Virtual Visit Location Provider: Home Office   I discussed the limitations of evaluation and management by telemedicine and the availability of in person appointments. The patient expressed understanding and agreed to proceed.    History of Present Illness: Ashley Massey is a 46 y.o. who identifies as a female who was assigned female at birth, and is being seen today for dysruria. She reports burning, frequency x 2 days. No fever. She is having cramping. Last UTI 4 yrs ago. No discharge.   HPI: HPI  Problems:  Patient Active Problem List   Diagnosis Date Noted   Elevated LFTs 07/04/2021   Muscle cramps 05/23/2021   Vitamin D insufficiency 05/23/2021   Body mass index 26.0-26.9, adult 12/11/2020   Hyperlipidemia 09/26/2019   Hypertension 09/26/2019   Anxiety 09/26/2019    Allergies: No Known Allergies Medications:  Current Outpatient Medications:    albuterol (VENTOLIN HFA) 108 (90 Base) MCG/ACT inhaler, Inhale 2 puffs into the lungs every 6 (six) hours as needed for wheezing or shortness of breath., Disp: 8 g, Rfl: 0   azithromycin (ZITHROMAX) 250 MG tablet, Take 500 mg once, then 250 mg for four days, Disp: 6 tablet, Rfl: 0   benzonatate (TESSALON PERLES) 100 MG capsule, Take 1 capsule (100 mg total) by mouth 3 (three) times daily as needed., Disp: 20 capsule, Rfl: 0   hydrochlorothiazide (HYDRODIURIL) 25 MG tablet, Take 0.5 tablets (12.5 mg total) by mouth daily., Disp: 90 tablet, Rfl: 0   hydrOXYzine (ATARAX) 50  MG tablet, Take 1 tablet by mouth every 8 hours as needed for anxiety. May cause drowsiness, no driving or legal decisions with medication., Disp: 60 tablet, Rfl: 1   lisinopril (ZESTRIL) 20 MG tablet, Take 1 tablet (20 mg total) by mouth daily., Disp: 90 tablet, Rfl: 3   medroxyPROGESTERone (DEPO-PROVERA) 150 MG/ML injection, Inject 150 mg into the muscle every 3 (three) months., Disp: , Rfl:    rosuvastatin (CRESTOR) 5 MG tablet, Take 1 tablet (5 mg total) by  mouth daily., Disp: 90 tablet, Rfl: 3  Observations/Objective: Patient is well-developed, well-nourished in no acute distress.  Resting comfortably  at home.  Head is normocephalic, atraumatic.  No labored breathing.  Speech is clear and coherent with logical content.  Patient is alert and oriented at baseline.    Assessment and Plan: 1. Acute cystitis without hematuria  Push fluids, preventative measures discussed, proceed to urgent care for worsening sx.   Follow Up Instructions: I discussed the assessment and treatment plan with the patient. The patient was provided an opportunity to ask questions and all were answered. The patient agreed with the plan and demonstrated an understanding of the instructions.  A copy of instructions were sent to the patient via MyChart unless otherwise noted below.     The patient was advised to call back or seek an in-person evaluation if the symptoms worsen or if the condition fails to improve as anticipated.  Time:  I spent 8 minutes with the patient via telehealth technology discussing the above problems/concerns.    Dellia Nims, FNP

## 2021-08-06 NOTE — Patient Instructions (Signed)
Urinary Tract Infection, Adult  A urinary tract infection (UTI) is an infection of any part of the urinary tract. The urinary tract includes the kidneys, ureters, bladder, and urethra. These organs make, store, and get rid of urine in the body. An upper UTI affects the ureters and kidneys. A lower UTI affects the bladder and urethra. What are the causes? Most urinary tract infections are caused by bacteria in your genital area around your urethra, where urine leaves your body. These bacteria grow and cause inflammation of your urinary tract. What increases the risk? You are more likely to develop this condition if: You have a urinary catheter that stays in place. You are not able to control when you urinate or have a bowel movement (incontinence). You are female and you: Use a spermicide or diaphragm for birth control. Have low estrogen levels. Are pregnant. You have certain genes that increase your risk. You are sexually active. You take antibiotic medicines. You have a condition that causes your flow of urine to slow down, such as: An enlarged prostate, if you are female. Blockage in your urethra. A kidney stone. A nerve condition that affects your bladder control (neurogenic bladder). Not getting enough to drink, or not urinating often. You have certain medical conditions, such as: Diabetes. A weak disease-fighting system (immunesystem). Sickle cell disease. Gout. Spinal cord injury. What are the signs or symptoms? Symptoms of this condition include: Needing to urinate right away (urgency). Frequent urination. This may include small amounts of urine each time you urinate. Pain or burning with urination. Blood in the urine. Urine that smells bad or unusual. Trouble urinating. Cloudy urine. Vaginal discharge, if you are female. Pain in the abdomen or the lower back. You may also have: Vomiting or a decreased appetite. Confusion. Irritability or tiredness. A fever or  chills. Diarrhea. The first symptom in older adults may be confusion. In some cases, they may not have any symptoms until the infection has worsened. How is this diagnosed? This condition is diagnosed based on your medical history and a physical exam. You may also have other tests, including: Urine tests. Blood tests. Tests for STIs (sexually transmitted infections). If you have had more than one UTI, a cystoscopy or imaging studies may be done to determine the cause of the infections. How is this treated? Treatment for this condition includes: Antibiotic medicine. Over-the-counter medicines to treat discomfort. Drinking enough water to stay hydrated. If you have frequent infections or have other conditions such as a kidney stone, you may need to see a health care provider who specializes in the urinary tract (urologist). In rare cases, urinary tract infections can cause sepsis. Sepsis is a life-threatening condition that occurs when the body responds to an infection. Sepsis is treated in the hospital with IV antibiotics, fluids, and other medicines. Follow these instructions at home:  Medicines Take over-the-counter and prescription medicines only as told by your health care provider. If you were prescribed an antibiotic medicine, take it as told by your health care provider. Do not stop using the antibiotic even if you start to feel better. General instructions Make sure you: Empty your bladder often and completely. Do not hold urine for long periods of time. Empty your bladder after sex. Wipe from front to back after urinating or having a bowel movement if you are female. Use each tissue only one time when you wipe. Drink enough fluid to keep your urine pale yellow. Keep all follow-up visits. This is important. Contact a health   care provider if: Your symptoms do not get better after 1-2 days. Your symptoms go away and then return. Get help right away if: You have severe pain in  your back or your lower abdomen. You have a fever or chills. You have nausea or vomiting. Summary A urinary tract infection (UTI) is an infection of any part of the urinary tract, which includes the kidneys, ureters, bladder, and urethra. Most urinary tract infections are caused by bacteria in your genital area. Treatment for this condition often includes antibiotic medicines. If you were prescribed an antibiotic medicine, take it as told by your health care provider. Do not stop using the antibiotic even if you start to feel better. Keep all follow-up visits. This is important. This information is not intended to replace advice given to you by your health care provider. Make sure you discuss any questions you have with your health care provider. Document Revised: 08/04/2019 Document Reviewed: 08/04/2019 Elsevier Patient Education  2023 [employer]  

## 2021-08-22 ENCOUNTER — Telehealth: Payer: Medicaid Other | Admitting: Family Medicine

## 2021-08-22 DIAGNOSIS — R300 Dysuria: Secondary | ICD-10-CM

## 2021-08-22 DIAGNOSIS — N3001 Acute cystitis with hematuria: Secondary | ICD-10-CM | POA: Diagnosis not present

## 2021-08-22 NOTE — Progress Notes (Signed)
[place]   Needs to be seen in person- failed treatment of UTI from 08/06/2021  Patient acknowledged agreement and understanding of the plan.

## 2021-09-05 DIAGNOSIS — Z419 Encounter for procedure for purposes other than remedying health state, unspecified: Secondary | ICD-10-CM | POA: Diagnosis not present

## 2021-09-30 ENCOUNTER — Other Ambulatory Visit: Payer: Self-pay | Admitting: Family

## 2021-09-30 DIAGNOSIS — F419 Anxiety disorder, unspecified: Secondary | ICD-10-CM

## 2021-10-02 ENCOUNTER — Other Ambulatory Visit: Payer: Self-pay

## 2021-10-02 DIAGNOSIS — I10 Essential (primary) hypertension: Secondary | ICD-10-CM

## 2021-10-05 DIAGNOSIS — Z419 Encounter for procedure for purposes other than remedying health state, unspecified: Secondary | ICD-10-CM | POA: Diagnosis not present

## 2021-11-05 DIAGNOSIS — Z419 Encounter for procedure for purposes other than remedying health state, unspecified: Secondary | ICD-10-CM | POA: Diagnosis not present

## 2022-09-03 IMAGING — MG MM DIGITAL SCREENING BILAT W/ TOMO AND CAD
8 series · 8 of 24 positions shown · non-contrast
Comparison: None.

CLINICAL DATA: Screening.

EXAM:
DIGITAL SCREENING BILATERAL MAMMOGRAM WITH TOMOSYNTHESIS AND CAD
TECHNIQUE: Bilateral screening digital craniocaudal and mediolateral oblique
mammograms were obtained. Bilateral screening digital breast
tomosynthesis was performed. The images were evaluated with
computer-aided detection.

[L CC synth-2D]
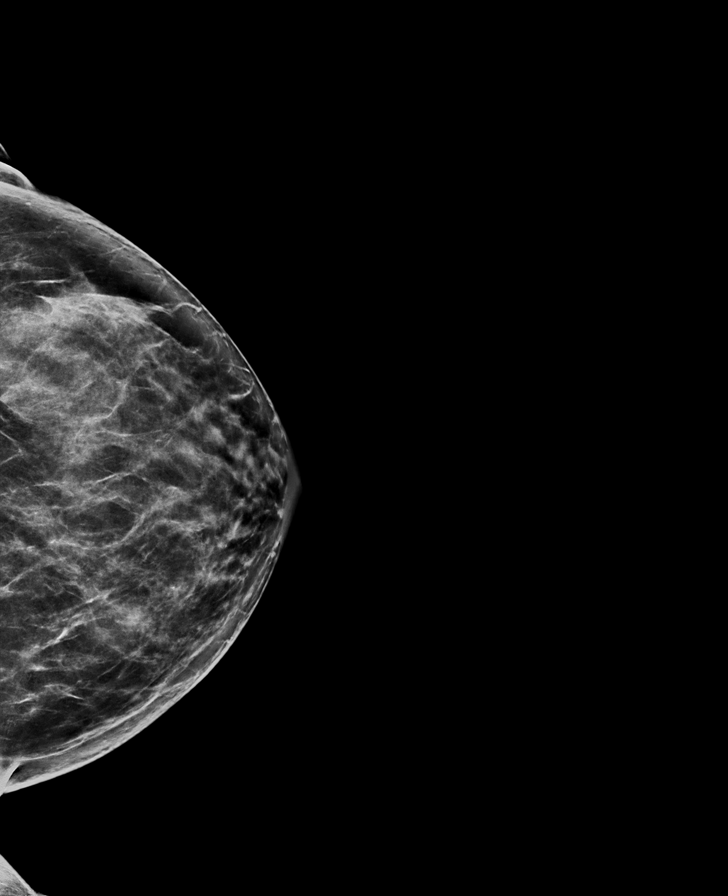

[R MLO synth-2D]
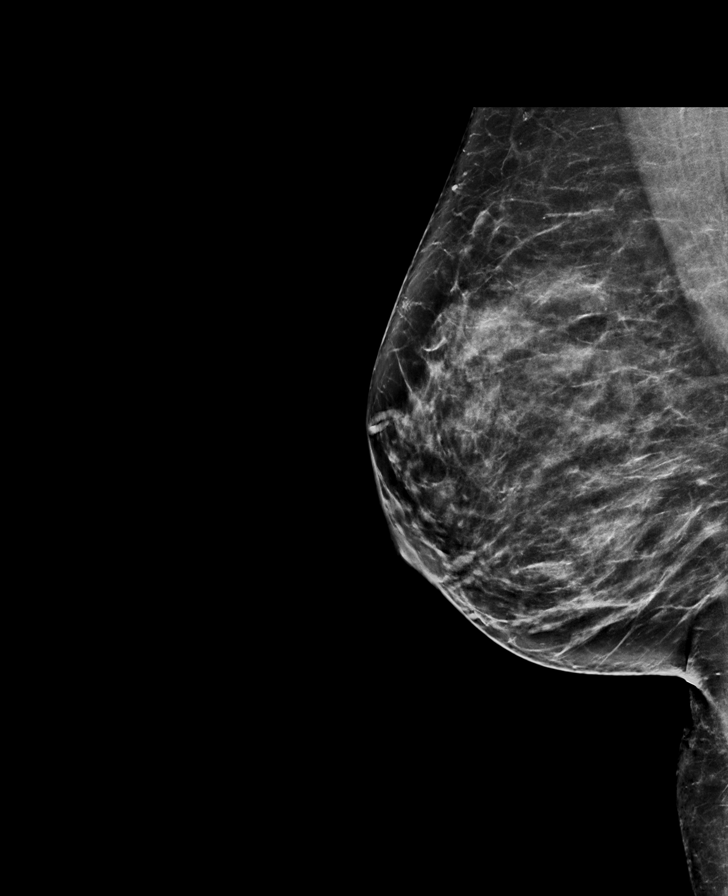

[L MLO synth-2D]
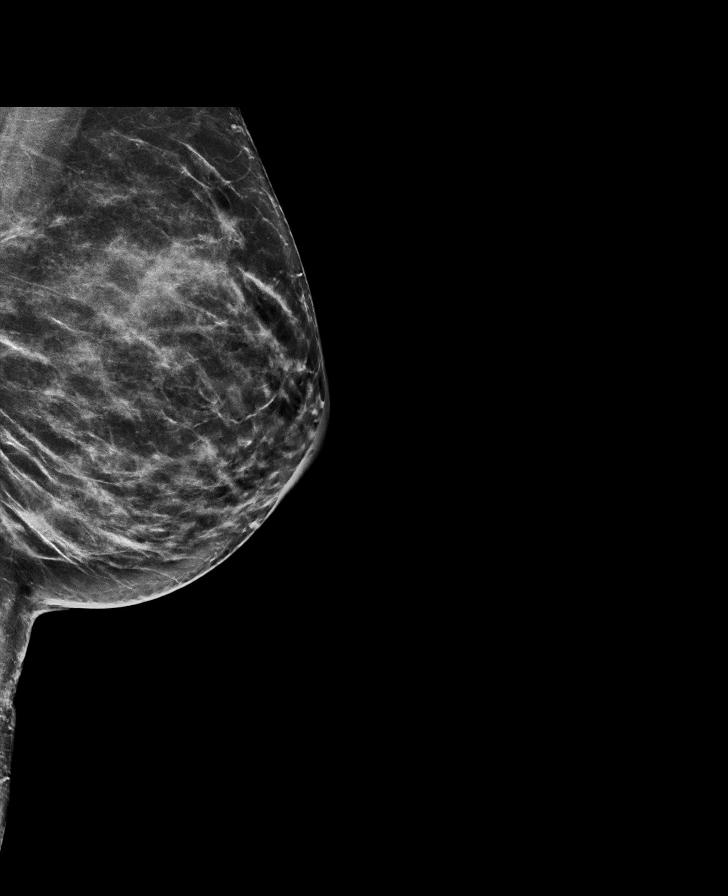

[R CC synth-2D]
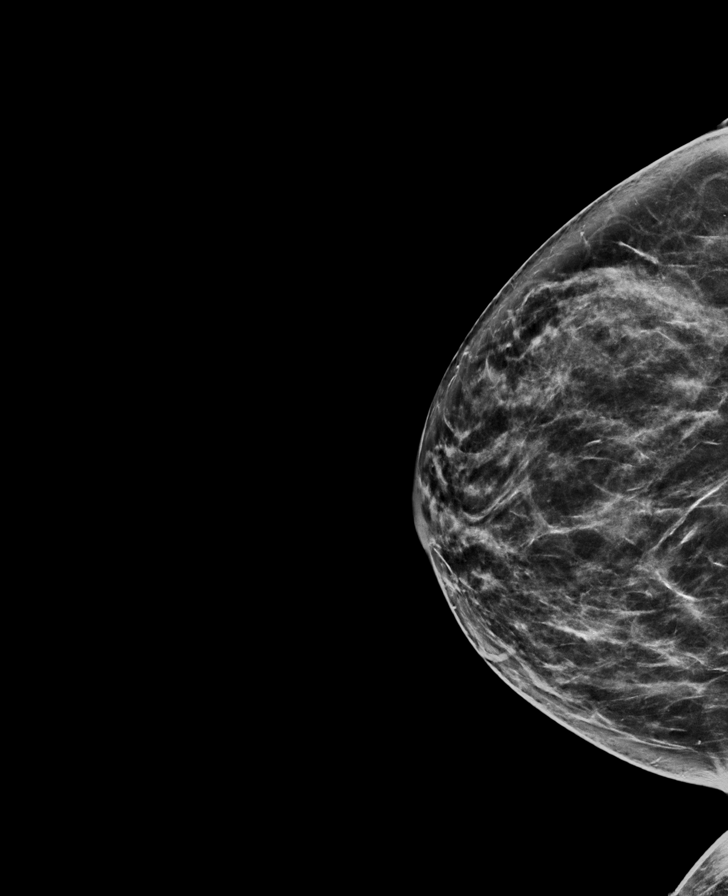

[R MLO tomo · tomo slice 43/85.0]
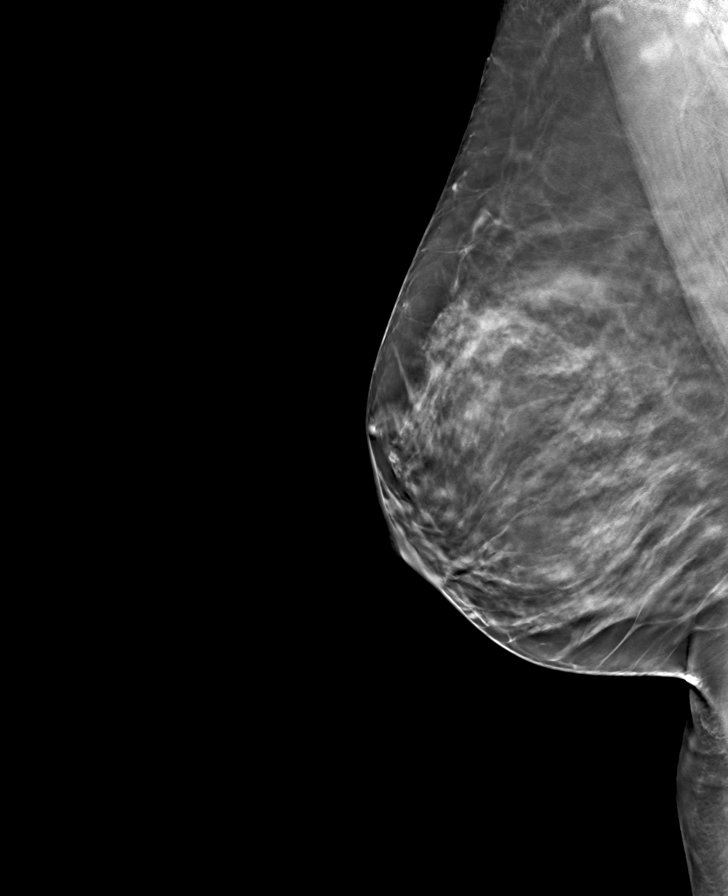

[R CC tomo · tomo slice 39/78.0]
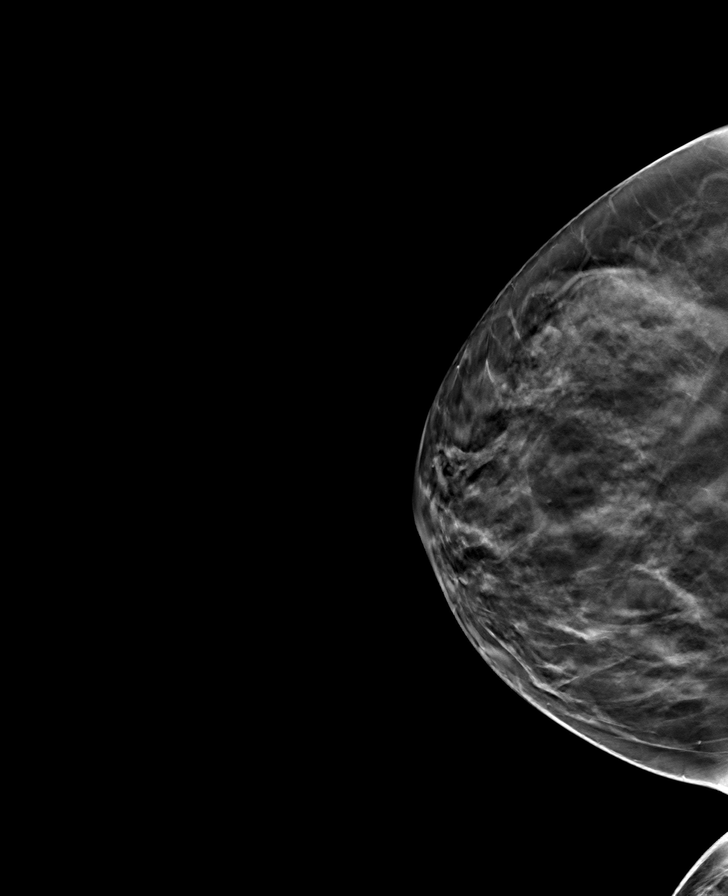

[L MLO tomo · tomo slice 41/82.0]
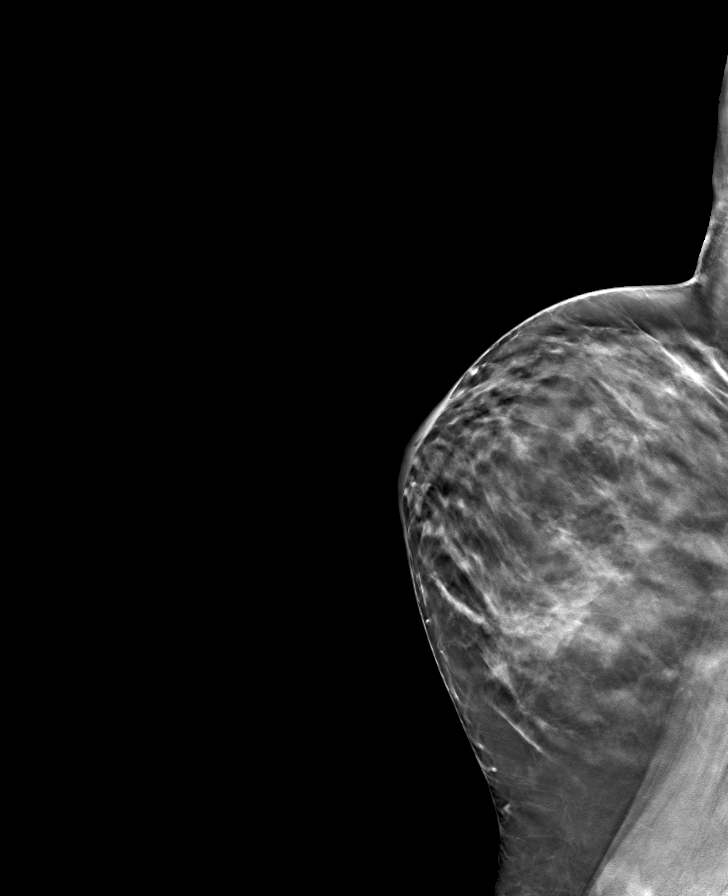

[L CC tomo · tomo slice 42/83.0]
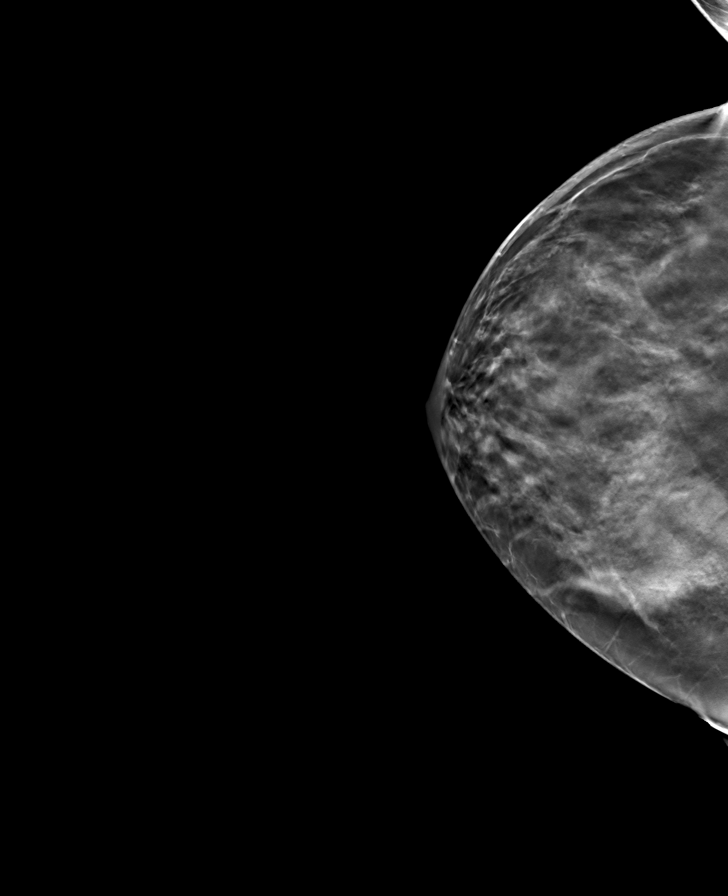

[8 of 24 positions shown; findings below may reference images not displayed]

ACR Breast Density Category c: The breast tissue is heterogeneously
dense, which may obscure small masses
FINDINGS: There are no findings suspicious for malignancy.
IMPRESSION: No mammographic evidence of malignancy. A result letter of this
screening mammogram will be mailed directly to the patient.

RECOMMENDATION:
Screening mammogram in one year. (Code:C8-T-HNK)

BI-RADS CATEGORY  1: Negative.
